# Patient Record
Sex: Female | Born: 1970 | Race: White | Hispanic: No | Marital: Married | Smoking: Former smoker
Health system: Southern US, Community
[De-identification: ages and names within clinical notes are randomized; demographics above are authoritative.]

## PROBLEM LIST (undated history)

## (undated) DIAGNOSIS — Z853 Personal history of malignant neoplasm of breast: Secondary | ICD-10-CM

## (undated) DIAGNOSIS — Z923 Personal history of irradiation: Secondary | ICD-10-CM

## (undated) DIAGNOSIS — C801 Malignant (primary) neoplasm, unspecified: Secondary | ICD-10-CM

## (undated) DIAGNOSIS — C50919 Malignant neoplasm of unspecified site of unspecified female breast: Secondary | ICD-10-CM

## (undated) HISTORY — DX: Personal history of malignant neoplasm of breast: Z85.3

## (undated) HISTORY — DX: Malignant (primary) neoplasm, unspecified: C80.1

---
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
## 1996-09-10 HISTORY — PX: CHOLECYSTECTOMY: SHX55

## 2004-09-08 ENCOUNTER — Ambulatory Visit: Payer: Self-pay | Admitting: Internal Medicine

## 2005-05-30 ENCOUNTER — Emergency Department: Payer: Self-pay | Admitting: Emergency Medicine

## 2005-10-02 ENCOUNTER — Ambulatory Visit: Payer: Self-pay | Admitting: General Practice

## 2006-05-27 ENCOUNTER — Ambulatory Visit: Payer: Self-pay | Admitting: Unknown Physician Specialty

## 2006-07-08 ENCOUNTER — Ambulatory Visit: Payer: Self-pay | Admitting: Urology

## 2006-09-10 DIAGNOSIS — Z923 Personal history of irradiation: Secondary | ICD-10-CM

## 2006-09-10 DIAGNOSIS — Z853 Personal history of malignant neoplasm of breast: Secondary | ICD-10-CM

## 2006-09-10 DIAGNOSIS — C50919 Malignant neoplasm of unspecified site of unspecified female breast: Secondary | ICD-10-CM

## 2006-09-10 HISTORY — DX: Personal history of malignant neoplasm of breast: Z85.3

## 2006-09-10 HISTORY — DX: Personal history of irradiation: Z92.3

## 2006-09-10 HISTORY — DX: Malignant neoplasm of unspecified site of unspecified female breast: C50.919

## 2006-09-10 HISTORY — PX: BREAST LUMPECTOMY: SHX2

## 2006-09-10 HISTORY — PX: BREAST EXCISIONAL BIOPSY: SUR124

## 2006-10-08 ENCOUNTER — Ambulatory Visit: Payer: Self-pay | Admitting: General Practice

## 2007-05-12 ENCOUNTER — Ambulatory Visit: Payer: Self-pay | Admitting: Oncology

## 2007-05-14 ENCOUNTER — Ambulatory Visit: Payer: Self-pay | Admitting: General Surgery

## 2007-05-14 ENCOUNTER — Other Ambulatory Visit: Payer: Self-pay

## 2007-05-21 ENCOUNTER — Ambulatory Visit: Payer: Self-pay | Admitting: General Surgery

## 2007-06-02 ENCOUNTER — Ambulatory Visit: Payer: Self-pay | Admitting: Radiation Oncology

## 2007-06-11 ENCOUNTER — Ambulatory Visit: Payer: Self-pay | Admitting: Oncology

## 2007-07-02 ENCOUNTER — Ambulatory Visit: Payer: Self-pay | Admitting: Oncology

## 2007-07-12 ENCOUNTER — Ambulatory Visit: Payer: Self-pay | Admitting: Oncology

## 2007-08-11 ENCOUNTER — Ambulatory Visit: Payer: Self-pay | Admitting: Oncology

## 2007-09-09 ENCOUNTER — Emergency Department: Payer: Self-pay | Admitting: Emergency Medicine

## 2007-09-11 ENCOUNTER — Ambulatory Visit: Payer: Self-pay | Admitting: Oncology

## 2007-10-12 ENCOUNTER — Ambulatory Visit: Payer: Self-pay | Admitting: Oncology

## 2007-10-20 ENCOUNTER — Ambulatory Visit: Payer: Self-pay | Admitting: Oncology

## 2007-11-09 ENCOUNTER — Ambulatory Visit: Payer: Self-pay | Admitting: Oncology

## 2007-12-08 ENCOUNTER — Ambulatory Visit: Payer: Self-pay | Admitting: General Surgery

## 2007-12-10 ENCOUNTER — Ambulatory Visit: Payer: Self-pay | Admitting: Oncology

## 2008-02-09 ENCOUNTER — Ambulatory Visit: Payer: Self-pay | Admitting: Oncology

## 2008-02-11 ENCOUNTER — Ambulatory Visit: Payer: Self-pay | Admitting: Oncology

## 2008-03-10 ENCOUNTER — Ambulatory Visit: Payer: Self-pay | Admitting: Oncology

## 2008-04-10 ENCOUNTER — Ambulatory Visit: Payer: Self-pay | Admitting: Oncology

## 2008-05-11 ENCOUNTER — Ambulatory Visit: Payer: Self-pay | Admitting: Oncology

## 2008-05-19 ENCOUNTER — Ambulatory Visit: Payer: Self-pay | Admitting: Oncology

## 2008-05-26 ENCOUNTER — Ambulatory Visit: Payer: Self-pay | Admitting: General Surgery

## 2008-06-10 ENCOUNTER — Ambulatory Visit: Payer: Self-pay | Admitting: Oncology

## 2008-09-10 ENCOUNTER — Ambulatory Visit: Payer: Self-pay | Admitting: Oncology

## 2008-09-20 ENCOUNTER — Ambulatory Visit: Payer: Self-pay | Admitting: Oncology

## 2008-10-11 ENCOUNTER — Ambulatory Visit: Payer: Self-pay | Admitting: Oncology

## 2008-11-22 ENCOUNTER — Ambulatory Visit: Payer: Self-pay | Admitting: General Surgery

## 2008-12-09 ENCOUNTER — Ambulatory Visit: Payer: Self-pay | Admitting: Oncology

## 2008-12-20 ENCOUNTER — Ambulatory Visit: Payer: Self-pay | Admitting: Oncology

## 2009-01-08 ENCOUNTER — Ambulatory Visit: Payer: Self-pay | Admitting: Oncology

## 2009-03-10 ENCOUNTER — Ambulatory Visit: Payer: Self-pay | Admitting: Oncology

## 2009-04-07 ENCOUNTER — Ambulatory Visit: Payer: Self-pay | Admitting: Oncology

## 2009-05-30 ENCOUNTER — Ambulatory Visit: Payer: Self-pay | Admitting: General Surgery

## 2009-09-10 ENCOUNTER — Ambulatory Visit: Payer: Self-pay | Admitting: Oncology

## 2009-10-10 ENCOUNTER — Ambulatory Visit: Payer: Self-pay | Admitting: Oncology

## 2009-10-11 ENCOUNTER — Ambulatory Visit: Payer: Self-pay | Admitting: Oncology

## 2009-11-23 ENCOUNTER — Ambulatory Visit: Payer: Self-pay | Admitting: General Surgery

## 2010-04-10 ENCOUNTER — Ambulatory Visit: Payer: Self-pay | Admitting: Oncology

## 2010-04-26 ENCOUNTER — Ambulatory Visit: Payer: Self-pay | Admitting: Oncology

## 2010-05-11 ENCOUNTER — Ambulatory Visit: Payer: Self-pay | Admitting: Oncology

## 2010-05-30 ENCOUNTER — Ambulatory Visit: Payer: Self-pay | Admitting: General Surgery

## 2010-11-29 ENCOUNTER — Ambulatory Visit: Payer: Self-pay | Admitting: General Surgery

## 2011-07-18 ENCOUNTER — Ambulatory Visit: Payer: Self-pay | Admitting: General Surgery

## 2011-12-24 ENCOUNTER — Ambulatory Visit: Payer: Self-pay | Admitting: General Surgery

## 2012-05-26 ENCOUNTER — Ambulatory Visit: Payer: Self-pay | Admitting: Oncology

## 2012-06-10 ENCOUNTER — Ambulatory Visit: Payer: Self-pay | Admitting: Oncology

## 2012-06-12 ENCOUNTER — Ambulatory Visit: Payer: Self-pay | Admitting: General Surgery

## 2012-06-13 ENCOUNTER — Other Ambulatory Visit: Payer: Self-pay | Admitting: General Surgery

## 2012-06-13 DIAGNOSIS — Z853 Personal history of malignant neoplasm of breast: Secondary | ICD-10-CM

## 2012-06-13 DIAGNOSIS — N63 Unspecified lump in unspecified breast: Secondary | ICD-10-CM

## 2012-06-13 DIAGNOSIS — N644 Mastodynia: Secondary | ICD-10-CM

## 2012-06-19 ENCOUNTER — Ambulatory Visit
Admission: RE | Admit: 2012-06-19 | Discharge: 2012-06-19 | Disposition: A | Discharge status: Home / Self Care | Payer: BC Managed Care – PPO | Source: Ambulatory Visit | Attending: General Surgery | Admitting: General Surgery

## 2012-06-19 DIAGNOSIS — N644 Mastodynia: Secondary | ICD-10-CM

## 2012-06-19 DIAGNOSIS — N63 Unspecified lump in unspecified breast: Secondary | ICD-10-CM

## 2012-06-19 DIAGNOSIS — Z853 Personal history of malignant neoplasm of breast: Secondary | ICD-10-CM

## 2012-06-19 MED ORDER — GADOBENATE DIMEGLUMINE 529 MG/ML IV SOLN
14.0000 mL | Freq: Once | INTRAVENOUS | Status: AC | PRN
  Administered 2012-06-19: 14 mL via INTRAVENOUS

## 2012-06-20 ENCOUNTER — Other Ambulatory Visit: Payer: Self-pay | Admitting: General Surgery

## 2012-06-20 DIAGNOSIS — R928 Other abnormal and inconclusive findings on diagnostic imaging of breast: Secondary | ICD-10-CM

## 2012-06-25 ENCOUNTER — Ambulatory Visit
Admission: RE | Admit: 2012-06-25 | Discharge: 2012-06-25 | Disposition: A | Discharge status: Home / Self Care | Payer: BC Managed Care – PPO | Source: Ambulatory Visit | Attending: General Surgery | Admitting: General Surgery

## 2012-06-25 ENCOUNTER — Other Ambulatory Visit: Payer: Self-pay | Admitting: Diagnostic Radiology

## 2012-06-25 DIAGNOSIS — R928 Other abnormal and inconclusive findings on diagnostic imaging of breast: Secondary | ICD-10-CM

## 2012-06-25 HISTORY — PX: BREAST BIOPSY: SHX20

## 2012-06-25 MED ORDER — GADOBENATE DIMEGLUMINE 529 MG/ML IV SOLN
14.0000 mL | Freq: Once | INTRAVENOUS | Status: AC | PRN
  Administered 2012-06-25: 14 mL via INTRAVENOUS

## 2012-11-08 ENCOUNTER — Encounter: Payer: Self-pay | Admitting: *Deleted

## 2013-01-20 ENCOUNTER — Ambulatory Visit: Payer: Self-pay | Admitting: General Surgery

## 2013-03-23 ENCOUNTER — Ambulatory Visit: Payer: Self-pay | Admitting: General Surgery

## 2013-03-30 ENCOUNTER — Ambulatory Visit: Payer: Self-pay | Admitting: General Surgery

## 2013-03-31 ENCOUNTER — Ambulatory Visit: Payer: Self-pay | Admitting: General Surgery

## 2013-04-02 ENCOUNTER — Encounter: Payer: Self-pay | Admitting: General Surgery

## 2013-04-06 ENCOUNTER — Ambulatory Visit: Payer: Self-pay | Admitting: General Surgery

## 2013-04-15 ENCOUNTER — Ambulatory Visit: Payer: Self-pay | Admitting: General Surgery

## 2013-05-04 ENCOUNTER — Ambulatory Visit (INDEPENDENT_AMBULATORY_CARE_PROVIDER_SITE_OTHER): Payer: BC Managed Care – PPO | Admitting: General Surgery

## 2013-05-04 ENCOUNTER — Encounter: Payer: Self-pay | Admitting: General Surgery

## 2013-05-04 VITALS — BP 118/68 | HR 74 | Resp 12 | Ht 69.0 in | Wt 156.0 lb

## 2013-05-04 DIAGNOSIS — Z853 Personal history of malignant neoplasm of breast: Secondary | ICD-10-CM | POA: Insufficient documentation

## 2013-05-04 NOTE — Patient Instructions (Signed)
Patient to return in one year. 

## 2013-05-04 NOTE — Progress Notes (Signed)
Patient ID: Jamie Wu, female   DOB: 1970-11-04, 42 y.o.   MRN: [id number]  Chief Complaint  Patient presents with  . Follow-up    mammogram    HPI Jamie Wu is a 42 y.o. female who presents for a breast evaluation. The most recent mammogram was done on 03/30/13 cat 2. Patient does perform regular self breast checks and gets regular mammograms done.    HPI  Past Medical History  Diagnosis Date  . Personal history of malignant neoplasm of breast 2008    L LUMPECTOMY  . 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 T1, N0, M0, histologic grade 1.  . Diabetes mellitus without complication     Past Surgical History  Procedure Laterality Date  . Breast lumpectomy Left 2008  . Cholecystectomy  1998  . Breast biopsy Left 2008    Family History  Problem Relation Age of Onset  . Colon cancer Paternal Uncle     lat3 50's   . Ovarian cancer    . Breast cancer      Social History History  Substance Use Topics  . Smoking status: Former Smoker -- 1.00 packs/day for 3 years  . Smokeless tobacco: Never Used  . Alcohol Use: Yes    Allergies  Allergen Reactions  . Sulfa Antibiotics Hives    No current outpatient prescriptions on file.   No current facility-administered medications for this visit.    Review of Systems Review of Systems  Constitutional: Negative.   Respiratory: Negative.   Cardiovascular: Negative.     Blood pressure 118/68, pulse 74, resp. rate 12, height 5\' 9"  (1.753 m), weight 156 lb (70.761 kg), last menstrual period 04/06/2013.  Physical Exam Physical Exam  Constitutional: She is oriented to person, place, and time. She appears well-developed and well-nourished.  Cardiovascular: Normal rate, regular rhythm and normal heart sounds.   Pulmonary/Chest: Breath sounds normal. Right breast exhibits no inverted nipple, no mass, no nipple discharge, no skin change and  no tenderness. Left breast exhibits no inverted nipple, no mass, no nipple discharge, no skin change and no tenderness.  Well healed left breast lower outer quadrant scar. Less than 1/2 cup size volume differential, left smaller.  Lymphadenopathy:    She has no cervical adenopathy.    She has no axillary adenopathy.  Neurological: She is alert and oriented to person, place, and time.  Skin: Skin is warm and dry.    Data Reviewed Bilateral mammograms dated March 30, 2013 were unremarkable. BI-RAD-2.  MRI guided biopsy of the right upper inner quadrant area dated June 25, 2012 showed fibrocystic changes with microcalcifications.  Assessment    Benign breast exam.     Plan    The patient desires to continue annual exams to this office.  The importance of a screening colonoscopy right 49 was reviewed in light of her paternal uncles family history and her history of breast cancer.        Earline Mayotte 05/04/2013, 10:24 PM

## 2014-04-16 ENCOUNTER — Encounter: Payer: Self-pay | Admitting: General Surgery

## 2014-04-20 ENCOUNTER — Encounter: Payer: Self-pay | Admitting: General Surgery

## 2014-04-30 ENCOUNTER — Encounter: Payer: Self-pay | Admitting: General Surgery

## 2014-05-03 ENCOUNTER — Ambulatory Visit (INDEPENDENT_AMBULATORY_CARE_PROVIDER_SITE_OTHER): Payer: BC Managed Care – PPO | Admitting: General Surgery

## 2014-05-03 ENCOUNTER — Encounter: Payer: Self-pay | Admitting: General Surgery

## 2014-05-03 VITALS — BP 124/76 | HR 76 | Resp 12 | Ht 69.0 in | Wt 155.0 lb

## 2014-05-03 DIAGNOSIS — Z853 Personal history of malignant neoplasm of breast: Secondary | ICD-10-CM

## 2014-05-03 NOTE — Patient Instructions (Addendum)
The patient has been asked to return to the office in one year with a bilateral screening mammogram. 

## 2014-05-03 NOTE — Progress Notes (Signed)
Patient ID: Jamie Wu, female   DOB: Sep 13, 1970, 43 y.o.   MRN: [id number]  Chief Complaint  Patient presents with  . Follow-up    mammogram    HPI Jamie Wu is a 43 y.o. female who presents for a breast evaluation. The most recent mammogram was done on 04/20/14 at [facility].  Patient states her left breast there is a tight feeling occasionally. Patient does perform regular self breast checks and gets regular mammograms done.   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   HPI  Past Medical History  Diagnosis Date  . Personal history of malignant neoplasm of breast 2008    L LUMPECTOMY  . Diabetes mellitus without complication   . 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 T1, N0, M0, histologic grade 1, BRCA negative by patient report.    Past Surgical History  Procedure Laterality Date  . Cholecystectomy  1998  . Breast lumpectomy Left 2008  . Breast biopsy Right June 25, 2012    MRI abnormality showing fibrocystic changes with microcalcifications.    Family History  Problem Relation Age of Onset  . Colon cancer Paternal Uncle     lat3 50's   . Ovarian cancer    . Breast cancer      Social History History  Substance Use Topics  . Smoking status: Former Smoker -- 1.00 packs/day for 3 years  . Smokeless tobacco: Never Used  . Alcohol Use: Yes    Allergies  Allergen Reactions  . Sulfa Antibiotics Hives    No current outpatient prescriptions on file.   No current facility-administered medications for this visit.    Review of Systems Review of Systems  Constitutional: Negative.   Respiratory: Negative.   Cardiovascular: Negative.     Blood pressure 124/76, pulse 76, resp. rate 12, height 5' 9"  (1.753 m), weight 155 lb (70.308 kg), last menstrual period 04/05/2014.  Physical Exam Physical  Exam  Constitutional: She is oriented to person, place, and time. She appears well-developed and well-nourished.  Eyes: Conjunctivae are normal. No scleral icterus.  Neck: Neck supple.  Cardiovascular: Normal rate, regular rhythm and normal heart sounds.   Pulmonary/Chest: Effort normal and breath sounds normal. Right breast exhibits no inverted nipple, no mass, no nipple discharge, no skin change and no tenderness. Left breast exhibits no inverted nipple, no mass, no nipple discharge, no skin change and no tenderness.  Lymphadenopathy:    She has no cervical adenopathy.    She has no axillary adenopathy.  Neurological: She is alert and oriented to person, place, and time.  Skin: Skin is warm and dry.    Data Reviewed Bilateral mammograms dated April 20, 2014 were reviewed and compared to previous exams. No interval change. BI-RAD-2.  Assessment     Well now 7 years status post conservative treatment of left breast cancer.     Plan    Will arrange for screening mammograms in one year with an office visit to follow.      PCP: Jayme Cloud 05/04/2014, 12:18 PM

## 2014-07-12 ENCOUNTER — Encounter: Payer: Self-pay | Admitting: General Surgery

## 2015-03-07 ENCOUNTER — Other Ambulatory Visit: Payer: Self-pay

## 2015-03-07 DIAGNOSIS — Z853 Personal history of malignant neoplasm of breast: Secondary | ICD-10-CM

## 2015-03-15 ENCOUNTER — Encounter: Payer: Self-pay | Admitting: General Surgery

## 2015-03-15 ENCOUNTER — Ambulatory Visit (INDEPENDENT_AMBULATORY_CARE_PROVIDER_SITE_OTHER): Payer: BLUE CROSS/BLUE SHIELD | Admitting: General Surgery

## 2015-03-15 VITALS — BP 100/64 | HR 88 | Resp 12 | Ht 69.0 in | Wt 155.0 lb

## 2015-03-15 DIAGNOSIS — R1031 Right lower quadrant pain: Secondary | ICD-10-CM

## 2015-03-15 NOTE — Patient Instructions (Signed)
The patient is aware to call back for any questions or concerns.  

## 2015-03-15 NOTE — Progress Notes (Signed)
Patient ID: Jamie Wu, female   DOB: May 25, 1971, 44 y.o.   MRN: [id number]  Chief Complaint  Patient presents with  . Other    pelivc pain    HPI Jamie Wu is a 44 y.o. female here today or an evaluation of lower right pelvic pain. She states this has been going on for a couple of years now. Last for about 5 to 7 days.  Pain describe as sharp (transient) as well as dull/ achy. The most recent episode within the last 2 weeks was the most severe, forcing her to curtail her activities for about 3 days. Episodes in the past of been annoying but not activity limiting.  She has not appreciated any activities that precipitated the pain or any particular treatments that relieve it. Tylenol/non-steroidal have not been helpful. She had an ultrasound  performed on 03/03/15. She states her emotional status was up and down in December due to many changes where she is employed as a Museum/gallery conservator Primary school teacher) and she made use of Xanax for about a month at that time with relief of her symptoms. With her most recent a Sparr episode of discomfort she started back on her anxiety medication. She states the medication does help.  Denies pain with intercourse. Menstrual periods are regular. The patient's episodes of pain seemed to come monthly but are not correlated with her menses.  No change in bowel habits.  The patient reports that she thinks that she is voiding more frequently. She does admit to drinking large quantities of fluid throughout the day and that when she does have the urge to void she will pass large monitor urine. She has appreciated some modest decrease in the time she has between the sense of needing to void and when she needs to the restroom. She is not following to that she's had any episodes of incontinence. Most recent normal labs were January.  HPI  Past Medical History  Diagnosis Date  . Personal history of malignant neoplasm of breast 2008    L LUMPECTOMY  . Diabetes  mellitus without complication   . 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 T1, N0, M0, histologic grade 1, BRCA negative by patient report.    Past Surgical History  Procedure Laterality Date  . Cholecystectomy  1998  . Breast lumpectomy Left 2008  . Breast biopsy Right June 25, 2012    MRI abnormality showing fibrocystic changes with microcalcifications.    Family History  Problem Relation Age of Onset  . Colon cancer Paternal Uncle     lat3 50's   . Breast cancer      Social History History  Substance Use Topics  . Smoking status: Former Smoker -- 1.00 packs/day for 3 years  . Smokeless tobacco: Never Used  . Alcohol Use: Yes    Allergies  Allergen Reactions  . Sulfa Antibiotics Hives    Current Outpatient Prescriptions  Medication Sig Dispense Refill  . ALPRAZolam (XANAX) 0.25 MG tablet Take 0.25 mg by mouth at bedtime as needed for anxiety.    . calcium & magnesium carbonates (MYLANTA) 311-232 MG per tablet Take 1 tablet by mouth daily.    . Cyanocobalamin (VITAMIN B 12 PO) Take by mouth.    . Thiamine HCl (VITAMIN B-1) 250 MG tablet Take 250 mg by mouth daily.     No current facility-administered medications for this visit.  Review of Systems Review of Systems  Constitutional: Negative.   Respiratory: Negative.   Cardiovascular: Negative.   Gastrointestinal: Positive for abdominal pain. Negative for nausea, vomiting, diarrhea, constipation, blood in stool, abdominal distention, anal bleeding and rectal pain.    Blood pressure 100/64, pulse 88, resp. rate 12, height 5' 9"  (1.753 m), weight 155 lb (70.308 kg), last menstrual period 03/01/2015.  Physical Exam Physical Exam  Constitutional: She is oriented to person, place, and time. She appears well-developed and well-nourished.  HENT:  Mouth/Throat: Oropharynx is clear and moist.  Eyes: Conjunctivae are normal. No  scleral icterus.  Neck: Neck supple.  Cardiovascular: Normal rate, regular rhythm and normal heart sounds.   Pulses:      Dorsalis pedis pulses are 2+ on the right side, and 2+ on the left side.       Posterior tibial pulses are 2+ on the right side, and 2+ on the left side.  Pulmonary/Chest: Effort normal and breath sounds normal.  Abdominal: Soft. Bowel sounds are normal. There is no hepatosplenomegaly. There is no tenderness. There is no CVA tenderness. No hernia.    Musculoskeletal:       Legs: Lymphadenopathy:    She has no cervical adenopathy.  Neurological: She is alert and oriented to person, place, and time.  Skin: Skin is warm and dry.    Data Reviewed GYN ultrasound dated 03/03/2015 no free fluid, normal ovaries with follicular cyst on the right more so than left, less than 2 cm. 10 mm endometrial thickness ( day 9 of her menstrual cycle).  Assessmet        Episodic right lower quadrant pain, negative clinical exam.     Plan    With no GI symptoms,   a negative clinical exam and pelvic ultrasound with findings as noted above, I think there is little to be gained by obtaining a CT scan or completing a colonoscopy. I think she would best served by diagnostic laparoscopic exam completed by the GYN service.  I would be happy to be present during this to exam to evaluate the abdominal viscera.  He patient will contact Dr. Ammie Dalton later this week to discuss his final recommendations regarding laparoscopic exam.   Follow up next month with mammogram as previously  scheduled.    Ref. Dr. Percell Boston, [name] 03/15/2015, 9:56 PM

## 2015-05-02 ENCOUNTER — Ambulatory Visit (INDEPENDENT_AMBULATORY_CARE_PROVIDER_SITE_OTHER): Payer: BLUE CROSS/BLUE SHIELD | Admitting: General Surgery

## 2015-05-02 ENCOUNTER — Encounter: Payer: Self-pay | Admitting: General Surgery

## 2015-05-02 VITALS — BP 120/70 | HR 68 | Resp 14 | Ht 69.0 in | Wt 156.0 lb

## 2015-05-02 DIAGNOSIS — Z853 Personal history of malignant neoplasm of breast: Secondary | ICD-10-CM | POA: Diagnosis not present

## 2015-05-02 NOTE — Progress Notes (Addendum)
Patient ID: Jamie Wu, female   DOB: 06/23/1971, 44 y.o.   MRN: [id number]  Chief Complaint  Patient presents with  . Follow-up    Bilateral Mammogram    HPI Jamie Wu is a 44 y.o. female who presents for a breast evaluation. The most recent mammogram was done on 04/28/15. Patient does perform regular self breast checks and gets regular mammograms done. She states that she has had some cyclical breast tenderness bilaterally.     HPI  Past Medical History  Diagnosis Date  . Personal history of malignant neoplasm of breast 2008    L LUMPECTOMY  . 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 T1, N0, M0, histologic grade 1, BRCA negative by patient report.    Past Surgical History  Procedure Laterality Date  . Breast lumpectomy Left 2008  . Breast biopsy Right June 25, 2012    MRI abnormality showing fibrocystic changes with microcalcifications.  . Cholecystectomy  1998    Family History  Problem Relation Age of Onset  . Colon cancer Paternal Uncle     lat3 41's     Social History Social History  Substance Use Topics  . Smoking status: Former Smoker -- 1.00 packs/day for 3 years    Quit date: 09/10/1993  . Smokeless tobacco: Never Used  . Alcohol Use: 0.0 oz/week    0 Standard drinks or equivalent per week     Comment: occas-wine    Allergies  Allergen Reactions  . Sulfa Antibiotics Hives    Current Outpatient Prescriptions  Medication Sig Dispense Refill  . ALPRAZolam (XANAX) 0.25 MG tablet Take 0.25 mg by mouth at bedtime as needed for anxiety.    . calcium & magnesium carbonates (MYLANTA) 311-232 MG per tablet Take 1 tablet by mouth daily.    . Cyanocobalamin (VITAMIN B 12 PO) Take by mouth.    . Thiamine HCl (VITAMIN B-1) 250 MG tablet Take 250 mg by mouth daily.     No current facility-administered medications for this visit.    Review of Systems Review of Systems   Constitutional: Negative.   Respiratory: Negative.   Cardiovascular: Negative.     Blood pressure 120/70, pulse 68, resp. rate 14, height 5' 9"  (1.753 m), weight 156 lb (70.761 kg), last menstrual period 04/17/2015.  Physical Exam Physical Exam  Constitutional: She is oriented to person, place, and time. She appears well-developed and well-nourished.  Eyes: Conjunctivae are normal. No scleral icterus.  Neck: Neck supple.  Cardiovascular: Normal rate, regular rhythm and normal heart sounds.   Pulmonary/Chest: Effort normal and breath sounds normal. Right breast exhibits no inverted nipple, no mass, no nipple discharge, no skin change and no tenderness. Left breast exhibits no inverted nipple, no mass, no nipple discharge, no skin change and no tenderness.    Well healed incision left breast 4o'ckl, and a well healed left axillary incision.   Lymphadenopathy:    She has no cervical adenopathy.  Neurological: She is alert and oriented to person, place, and time.  Skin: Skin is warm and dry.  Psychiatric: She has a normal mood and affect.    Data Reviewed Laboratory studies dated 08/16/2014 were reviewed. The only abnormality was her serum total bilirubin which was 1.3. In February 2008 this was 1.7 mg/daL consistent with Gilbert's syndrome.  Bilateral diagnostic mammograms and ureteral lateral right breast mammogram dated 04/29/2015 was reviewed. Multiple subcentimeter nodules  were noted in the right breast. Ultrasound showed multiple cysts. BI-RADS-2.  Assessment    Doing well now 8 years status post management of a T1b carcinoma the left breast.    Plan    The patient was offered the operative 8 to have her annual mammograms, screening, completed with her GYN. This is acceptable to her. She is welcome to return at any time if any concerns develop on clinical exam or mammography.  The patient should plan on a screening colonoscopy at age 37. She reports that her older sister is  due to have a study within the next year. If her sister has adenomatous polyps the patient should be screened early.      PCP: [facility]YN: Percell Boston, [name] 05/02/2015, 4:03 PM

## 2015-05-02 NOTE — Patient Instructions (Addendum)
Continue self breast exams. Call office for any new breast issues or concerns. Bilateral Screening Mammogram in one year with GYN.

## 2015-09-01 ENCOUNTER — Other Ambulatory Visit: Payer: Self-pay | Admitting: Family Medicine

## 2015-09-01 DIAGNOSIS — R109 Unspecified abdominal pain: Secondary | ICD-10-CM

## 2015-09-06 ENCOUNTER — Ambulatory Visit: Admission: RE | Admit: 2015-09-06 | Payer: Self-pay | Source: Ambulatory Visit

## 2015-09-06 ENCOUNTER — Ambulatory Visit
Admission: RE | Admit: 2015-09-06 | Discharge: 2015-09-06 | Disposition: A | Discharge status: Home / Self Care | Payer: BLUE CROSS/BLUE SHIELD | Source: Ambulatory Visit | Attending: Family Medicine | Admitting: Family Medicine

## 2015-09-06 DIAGNOSIS — R1031 Right lower quadrant pain: Secondary | ICD-10-CM | POA: Insufficient documentation

## 2015-09-06 DIAGNOSIS — K5730 Diverticulosis of large intestine without perforation or abscess without bleeding: Secondary | ICD-10-CM | POA: Diagnosis not present

## 2015-09-06 DIAGNOSIS — K7689 Other specified diseases of liver: Secondary | ICD-10-CM | POA: Diagnosis not present

## 2015-09-06 DIAGNOSIS — Z853 Personal history of malignant neoplasm of breast: Secondary | ICD-10-CM | POA: Diagnosis present

## 2015-09-06 DIAGNOSIS — N281 Cyst of kidney, acquired: Secondary | ICD-10-CM | POA: Diagnosis not present

## 2015-09-06 MED ORDER — IOHEXOL 350 MG/ML SOLN
85.0000 mL | Freq: Once | INTRAVENOUS | Status: AC | PRN
  Administered 2015-09-06: 85 mL via INTRAVENOUS

## 2015-09-15 ENCOUNTER — Encounter: Payer: Self-pay | Admitting: General Surgery

## 2015-09-15 ENCOUNTER — Ambulatory Visit (INDEPENDENT_AMBULATORY_CARE_PROVIDER_SITE_OTHER): Payer: BLUE CROSS/BLUE SHIELD | Admitting: General Surgery

## 2015-09-15 VITALS — BP 104/68 | HR 88 | Resp 12 | Ht 69.0 in | Wt 147.0 lb

## 2015-09-15 DIAGNOSIS — R1031 Right lower quadrant pain: Secondary | ICD-10-CM

## 2015-09-15 DIAGNOSIS — N6001 Solitary cyst of right breast: Secondary | ICD-10-CM | POA: Diagnosis not present

## 2015-09-15 NOTE — Progress Notes (Signed)
Patient ID: Jamie Wu, female   DOB: 27-Apr-1971, 45 y.o.   MRN: [id number]  Chief Complaint  Patient presents with  . Abdominal Pain    HPI Jamie Wu is a 45 y.o. female here today for a evaluation of abdominal pain. She had a ct scan done on 09/06/15. She states she has been having lower right abdomen pain to right flank and hip "in a band". It has been going on at lest since June 2016 or even longer. She remembers the pain being bad August 10 2015 at a party. Within the last week minimal to no pain, prior to that it was consistent but not constant, lasting hours. If she overeats she notices the pain. Moving her bowels can make it better. The nausea would come and go not associated with the pain. The diarrhea and constipation is intermittent and more assocatied with her diet over the past 2 years.  Denies rectal pain. No persistent discomfort during intercourse.  The patient's weight is down 8 pounds over the past year, no precipitous drop.  She can also feel something new under her right nipple. She states it is about "BB" in size and found it about 3-4 days ago.  I reviewed the patient's clinical history. HPI  Past Medical History  Diagnosis Date  . Personal history of malignant neoplasm of breast 2008    L LUMPECTOMY  . 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 T1, N0, M0, histologic grade 1, BRCA negative by patient report.    Past Surgical History  Procedure Laterality Date  . Breast lumpectomy Left 2008  . Breast biopsy Right June 25, 2012    MRI abnormality showing fibrocystic changes with microcalcifications.  . Cholecystectomy  1998    Family History  Problem Relation Age of Onset  . Colon cancer Paternal Uncle     lat3 59's     Social History Social History  Substance Use Topics  . Smoking status: Former Smoker -- 1.00 packs/day for 3 years    Quit date:  09/10/1993  . Smokeless tobacco: Never Used  . Alcohol Use: 0.0 oz/week    0 Standard drinks or equivalent per week     Comment: occas-wine    Allergies  Allergen Reactions  . Contrast Media [Iodinated Diagnostic Agents] Hives    Pt get hives, itching and sneezing.   . Sulfa Antibiotics Hives    No current outpatient prescriptions on file.   No current facility-administered medications for this visit.    Review of Systems Review of Systems  Constitutional: Negative.   Respiratory: Negative.   Cardiovascular: Negative.   Gastrointestinal: Positive for nausea, abdominal pain, diarrhea and constipation. Negative for vomiting.    Blood pressure 104/68, pulse 88, resp. rate 12, height 5' 9"  (1.753 m), weight 147 lb (66.679 kg), last menstrual period 08/22/2015.  Physical Exam Physical Exam  Constitutional: She is oriented to person, place, and time. She appears well-developed and well-nourished.  HENT:  Mouth/Throat: Oropharynx is clear and moist.  Eyes: Conjunctivae are normal. No scleral icterus.  Neck: Neck supple.  Cardiovascular: Normal rate, regular rhythm and normal heart sounds.   Pulses:      Femoral pulses are 2+ on the right side, and 2+ on the left side.      Dorsalis pedis pulses are 2+ on the right side, and 2+ on the left side.  No lower  leg edema.  Pulmonary/Chest: Effort normal and breath sounds normal. Right breast exhibits mass. Right breast exhibits no inverted nipple, no nipple discharge, no skin change and no tenderness. Left breast exhibits no inverted nipple, no mass, no nipple discharge, no skin change and no tenderness.  Well healed incision at 3 o'clock left breast. 6 mm nodule 8 o'clock edge of areolar right breast.  Abdominal: Soft. Normal appearance and bowel sounds are normal. There is no tenderness.  Musculoskeletal: Normal range of motion.  Lymphadenopathy:    She has no cervical adenopathy.    She has no axillary adenopathy.  Neurological:  She is alert and oriented to person, place, and time.  Skin: Skin is warm and dry.  Psychiatric: Her behavior is normal.    [facility] physician notes from 09/01/2015 reviewed.  CT scan of the abdomen and pelvis dated 09/06/2015 was personally reviewed. CT showed suspected slightly complex cyst above the liver in the right lobe and as well as in the kidney. Of unlikely significance. The cecum extends down into the pelvis and extends across the midline to the right. Diverticulosis.  Urine culture of December 22 was less than 10,000 colonies. Laboratory studies of the same date showed a normal white blood cell count of 5200 with an MCV of 86, hemoglobin of 14.2.   04/29/2015 mammogram and ultrasound completed at [facility] had confirmed multiple cyst within the right breast, one corresponding exactly what the lesion palpable on today's exam.  Assessment    Right lower quadrant pain of unclear etiology, no suspicion for colonic source. No evidence suggest metastatic disease/bony involvement.  Breast cyst, now notable with modest weight loss.    Plan    No specific recommendations at present.    Monitor and call for worsening symptoms. Follow up as scheduled.    PCP:  Dr.Joseph Corinda Gubler   This information has been scribed by Karie Fetch RNBC.   Robert Bellow 09/17/2015, 10:07 AM

## 2015-09-15 NOTE — Patient Instructions (Signed)
The patient is aware to call back for any questions or concerns.  

## 2016-09-25 ENCOUNTER — Telehealth: Payer: Self-pay | Admitting: *Deleted

## 2016-09-25 NOTE — Telephone Encounter (Signed)
Patient called and wanted to know if you could continue her care and mammograms? She has not had a mammogram since 04/2015. She stated her regular GYN doctor retired and she has not found a new one. I offered her [facility] information and she declined. Please advise. Thanks

## 2016-09-26 NOTE — Telephone Encounter (Signed)
That will be fine.  Screening mammogram and OV to follow.

## 2016-10-01 ENCOUNTER — Other Ambulatory Visit: Payer: Self-pay | Admitting: General Surgery

## 2016-10-01 ENCOUNTER — Encounter: Payer: Self-pay | Admitting: *Deleted

## 2016-10-01 DIAGNOSIS — Z1239 Encounter for other screening for malignant neoplasm of breast: Secondary | ICD-10-CM

## 2016-10-30 ENCOUNTER — Ambulatory Visit
Admission: RE | Admit: 2016-10-30 | Discharge: 2016-10-30 | Disposition: A | Discharge status: Home / Self Care | Payer: BLUE CROSS/BLUE SHIELD | Source: Ambulatory Visit | Attending: General Surgery | Admitting: General Surgery

## 2016-10-30 DIAGNOSIS — Z1231 Encounter for screening mammogram for malignant neoplasm of breast: Secondary | ICD-10-CM | POA: Diagnosis present

## 2016-10-30 DIAGNOSIS — Z1239 Encounter for other screening for malignant neoplasm of breast: Secondary | ICD-10-CM

## 2016-11-13 ENCOUNTER — Ambulatory Visit: Payer: BLUE CROSS/BLUE SHIELD | Admitting: General Surgery

## 2017-05-02 ENCOUNTER — Encounter: Payer: Self-pay | Admitting: Medical Oncology

## 2017-05-02 ENCOUNTER — Emergency Department
Admission: EM | Admit: 2017-05-02 | Discharge: 2017-05-02 | Disposition: A | Discharge status: Home / Self Care | Payer: BLUE CROSS/BLUE SHIELD | Attending: Emergency Medicine | Admitting: Emergency Medicine

## 2017-05-02 ENCOUNTER — Emergency Department: Payer: BLUE CROSS/BLUE SHIELD

## 2017-05-02 DIAGNOSIS — Z87891 Personal history of nicotine dependence: Secondary | ICD-10-CM | POA: Diagnosis not present

## 2017-05-02 DIAGNOSIS — R1032 Left lower quadrant pain: Secondary | ICD-10-CM | POA: Diagnosis present

## 2017-05-02 DIAGNOSIS — R109 Unspecified abdominal pain: Secondary | ICD-10-CM

## 2017-05-02 LAB — URINALYSIS, COMPLETE (UACMP) WITH MICROSCOPIC
Bilirubin Urine: NEGATIVE
GLUCOSE, UA: NEGATIVE mg/dL
Hgb urine dipstick: NEGATIVE
Ketones, ur: NEGATIVE mg/dL
LEUKOCYTES UA: NEGATIVE
Nitrite: NEGATIVE
PROTEIN: NEGATIVE mg/dL
Specific Gravity, Urine: 1.003 — ABNORMAL LOW (ref 1.005–1.030)
pH: 6 (ref 5.0–8.0)

## 2017-05-02 LAB — BASIC METABOLIC PANEL
Anion gap: 5 (ref 5–15)
BUN: 11 mg/dL (ref 6–20)
CO2: 27 mmol/L (ref 22–32)
Calcium: 9.1 mg/dL (ref 8.9–10.3)
Chloride: 105 mmol/L (ref 101–111)
Creatinine, Ser: 0.65 mg/dL (ref 0.44–1.00)
GFR calc Af Amer: >60 mL/min (ref >60)
GLUCOSE: 88 mg/dL (ref 65–99)
POTASSIUM: 3.6 mmol/L (ref 3.5–5.1)
SODIUM: 137 mmol/L (ref 135–145)

## 2017-05-02 LAB — CBC
HEMATOCRIT: 40.2 % (ref 35.0–47.0)
HEMOGLOBIN: 13.8 g/dL (ref 12.0–16.0)
MCH: 29.2 pg (ref 26.0–34.0)
MCHC: 34.4 g/dL (ref 32.0–36.0)
MCV: 84.8 fL (ref 80.0–100.0)
Platelets: 210 10*3/uL (ref 150–440)
RBC: 4.74 MIL/uL (ref 3.80–5.20)
RDW: 12.8 % (ref 11.5–14.5)
WBC: 6 10*3/uL (ref 3.6–11.0)

## 2017-05-02 LAB — POCT PREGNANCY, URINE: PREG TEST UR: NEGATIVE

## 2017-05-02 MED ORDER — KETOROLAC TROMETHAMINE 10 MG PO TABS: 10.0000 mg | ORAL_TABLET | Freq: Four times a day (QID) | ORAL | 0 refills | Status: DC | PRN

## 2017-05-02 MED ORDER — KETOROLAC TROMETHAMINE 10 MG PO TABS
10.0000 mg | ORAL_TABLET | Freq: Once | ORAL | Status: AC
  Administered 2017-05-02: 10 mg via ORAL
  Filled 2017-05-02: qty 1

## 2017-05-02 NOTE — Discharge Instructions (Signed)
Results for orders placed or performed during the hospital encounter of 05/02/17  Urinalysis, Complete w Microscopic  Result Value Ref Range   Color, Urine STRAW (A) YELLOW   APPearance CLEAR (A) CLEAR   Specific Gravity, Urine 1.003 (L) 1.005 - 1.030   pH 6.0 5.0 - 8.0   Glucose, UA NEGATIVE NEGATIVE mg/dL   Hgb urine dipstick NEGATIVE NEGATIVE   Bilirubin Urine NEGATIVE NEGATIVE   Ketones, ur NEGATIVE NEGATIVE mg/dL   Protein, ur NEGATIVE NEGATIVE mg/dL   Nitrite NEGATIVE NEGATIVE   Leukocytes, UA NEGATIVE NEGATIVE   RBC / HPF 0-5 0 - 5 RBC/hpf   WBC, UA 0-5 0 - 5 WBC/hpf   Bacteria, UA RARE (A) NONE SEEN   Squamous Epithelial / LPF 0-5 (A) NONE SEEN  Basic metabolic panel  Result Value Ref Range   Sodium 137 135 - 145 mmol/L   Potassium 3.6 3.5 - 5.1 mmol/L   Chloride 105 101 - 111 mmol/L   CO2 27 22 - 32 mmol/L   Glucose, Bld 88 65 - 99 mg/dL   BUN 11 6 - 20 mg/dL   Creatinine, Ser 0.65 0.44 - 1.00 mg/dL   Calcium 9.1 8.9 - 10.3 mg/dL   GFR calc non Af Amer >60 >60 mL/min   GFR calc Af Amer >60 >60 mL/min   Anion gap 5 5 - 15  CBC  Result Value Ref Range   WBC 6.0 3.6 - 11.0 K/uL   RBC 4.74 3.80 - 5.20 MIL/uL   Hemoglobin 13.8 12.0 - 16.0 g/dL   HCT 40.2 35.0 - 47.0 %   MCV 84.8 80.0 - 100.0 fL   MCH 29.2 26.0 - 34.0 pg   MCHC 34.4 32.0 - 36.0 g/dL   RDW 12.8 11.5 - 14.5 %   Platelets 210 150 - 440 K/uL  Pregnancy, urine POC  Result Value Ref Range   Preg Test, Ur NEGATIVE NEGATIVE   Ct Renal Stone Study  Result Date: 05/02/2017 CLINICAL DATA:  Pt reports left sided flank pain that started yesterday and is radiating down into abdomen. Pt denies dysuria or fever. No hx of kidney stones. EXAM: CT ABDOMEN AND PELVIS WITHOUT CONTRAST TECHNIQUE: Multidetector CT imaging of the abdomen and pelvis was performed following the standard protocol without IV contrast. COMPARISON:  09/06/2015 FINDINGS: Lower chest: Lung bases are clear. Hepatobiliary: No focal hepatic lesion.  Postcholecystectomy. No biliary dilatation. Low-density cysts in the posterior RIGHT hepatic lobe. Pancreas: Pancreas is normal. No ductal dilatation. No pancreatic inflammation. Spleen: Normal spleen Adrenals/urinary tract: Adrenal glands are normal. No nephrolithiasis or ureterolithiasis. Multiple vascular calcifications in the pelvis. Bladder normal. No bladder calculi identified. Along the lateral margin of the LEFT kidney there is a simple fluid density 18 mm lesion unchanged from comparison exam 2016 Stomach/Bowel: Stomach, small bowel, appendix, and cecum are normal. Several diverticular the descending colon sigmoid colon without acute inflammation. Rectum normal Vascular/Lymphatic: Abdominal aorta is normal caliber. There is no retroperitoneal or periportal lymphadenopathy. No pelvic lymphadenopathy. Reproductive: Uterus and ovaries normal Other: Small volume free fluid in the posterior cul-de-sac (image 70, series 2 Musculoskeletal: No aggressive osseous lesion. IMPRESSION: 1. No nephrolithiasis or ureterolithiasis. 2. Extensive LEFT colon sigmoid colon diverticulosis but no clear evidence acute diverticulitis. 3. Small volume of free fluid the pelvis with indeterminate etiology. Probable physiologic . 4. Cholecystectomy. Electronically Signed   By: Suzy Bouchard M.D.   On: 05/02/2017 10:48

## 2017-05-02 NOTE — ED Triage Notes (Signed)
Pt reports left sided flank pain that started yesterday and is radiating down into abdomen. Pt denies dysuria or fever. NAD noted.

## 2017-05-02 NOTE — ED Provider Notes (Signed)
[facility] Emergency Department Provider Note  ____________________________________________  Time seen: Approximately 10:24 AM  I have reviewed the triage vital signs and the nursing notes.   HISTORY  Chief Complaint Flank Pain    HPI Jamie Wu is a 46 y.o. female who complains of left flank pain radiating to the left lower quadrant that started at 10 AM yesterday. Pain is been constant but waxing and waning, severe, described as burning. No aggravating or alleviating factors. Denies dysuria frequency urgency fevers chills or sweats. Never had anything like this before. Denies history of kidney stones.     Past Medical History:  Diagnosis Date  . 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 T1, N0, M0, histologic grade 1, BRCA negative by patient report.  . Personal history of malignant neoplasm of breast 2008   L LUMPECTOMY  Patient completed radiation without chemotherapy   Patient Active Problem List   Diagnosis Date Noted  . Right lower quadrant abdominal pain 03/15/2015  . History of breast cancer 05/04/2013     Past Surgical History:  Procedure Laterality Date  . BREAST BIOPSY Right June 25, 2012   MRI abnormality showing fibrocystic changes with microcalcifications.  Marland Kitchen BREAST EXCISIONAL BIOPSY Left 2008   breast ca  . BREAST LUMPECTOMY Left 2008  . CHOLECYSTECTOMY  1998     Prior to Admission medications   Medication Sig Start Date End Date Taking? Authorizing Provider  ketorolac (TORADOL) 10 MG tablet Take 1 tablet (10 mg total) by mouth every 6 (six) hours as needed for moderate pain. 05/02/17   Carrie Mew, MD  None, Tylenol as needed   Allergies Contrast media [iodinated diagnostic agents] and Sulfa antibiotics   Family History  Problem Relation Age of Onset  . Colon cancer Paternal Uncle        lat3 33's     Social  History Social History  Substance Use Topics  . Smoking status: Former Smoker    Packs/day: 1.00    Years: 3.00    Quit date: 09/10/1993  . Smokeless tobacco: Never Used  . Alcohol use 0.0 oz/week     Comment: occas-wine    Review of Systems  Constitutional:   No fever or chills.  ENT:   No sore throat. No rhinorrhea. Cardiovascular:   No chest pain or syncope. Respiratory:   No dyspnea or cough. Gastrointestinal:   Left flank pain as above. No vomiting or diarrhea..  Musculoskeletal:   Negative for focal pain or swelling All other systems reviewed and are negative except as documented above in ROS and HPI.  ____________________________________________   PHYSICAL EXAM:  VITAL SIGNS: ED Triage Vitals  Enc Vitals Group     BP 05/02/17 0938 140/87     Pulse Rate 05/02/17 0938 77     Resp 05/02/17 0938 16     Temp 05/02/17 0938 97.7 F (36.5 C)     Temp Source 05/02/17 0938 Oral     SpO2 05/02/17 0938 100 %     Weight 05/02/17 0939 150 lb (68 kg)     Height 05/02/17 0939 5' 8"  (1.727 m)     Head Circumference --      Peak Flow --      Pain Score 05/02/17 0938 6     Pain Loc --      Pain Edu? --      Excl. in  GC? --     Vital signs reviewed, nursing assessments reviewed.   Constitutional:   Alert and oriented. Well appearing and in no distress. Eyes:   No scleral icterus.  EOMI. No nystagmus. No conjunctival pallor. PERRL. ENT   Head:   Normocephalic and atraumatic.   Nose:   No congestion/rhinnorhea.    Mouth/Throat:   MMM, no pharyngeal erythema. No peritonsillar mass.    Neck:   No meningismus. Full ROM Hematological/Lymphatic/Immunilogical:   No cervical lymphadenopathy. Cardiovascular:   RRR. Symmetric bilateral radial and DP pulses.  No murmurs.  Respiratory:   Normal respiratory effort without tachypnea/retractions. Breath sounds are clear and equal bilaterally. No wheezes/rales/rhonchi. Gastrointestinal:   Soft and nontender. Non distended.  There is no CVA tenderness.  No rebound, rigidity, or guarding. Genitourinary:   deferred Musculoskeletal:   Normal range of motion in all extremities. No joint effusions.  No lower extremity tenderness.  No edema. Neurologic:   Normal speech and language.  Motor grossly intact. No gross focal neurologic deficits are appreciated.  Skin:    Skin is warm, dry and intact. No rash noted.  No petechiae, purpura, or bullae.  ____________________________________________    LABS (pertinent positives/negatives) (all labs ordered are listed, but only abnormal results are displayed) Labs Reviewed  URINALYSIS, COMPLETE (UACMP) WITH MICROSCOPIC - Abnormal; Notable for the following:       Result Value   Color, Urine STRAW (*)    APPearance CLEAR (*)    Specific Gravity, Urine 1.003 (*)    Bacteria, UA RARE (*)    Squamous Epithelial / LPF 0-5 (*)    All other components within normal limits  BASIC METABOLIC PANEL  CBC  POCT PREGNANCY, URINE   ____________________________________________   EKG    ____________________________________________    RADIOLOGY  Ct Renal Stone Study  Result Date: 05/02/2017 CLINICAL DATA:  Pt reports left sided flank pain that started yesterday and is radiating down into abdomen. Pt denies dysuria or fever. No hx of kidney stones. EXAM: CT ABDOMEN AND PELVIS WITHOUT CONTRAST TECHNIQUE: Multidetector CT imaging of the abdomen and pelvis was performed following the standard protocol without IV contrast. COMPARISON:  09/06/2015 FINDINGS: Lower chest: Lung bases are clear. Hepatobiliary: No focal hepatic lesion. Postcholecystectomy. No biliary dilatation. Low-density cysts in the posterior RIGHT hepatic lobe. Pancreas: Pancreas is normal. No ductal dilatation. No pancreatic inflammation. Spleen: Normal spleen Adrenals/urinary tract: Adrenal glands are normal. No nephrolithiasis or ureterolithiasis. Multiple vascular calcifications in the pelvis. Bladder normal. No  bladder calculi identified. Along the lateral margin of the LEFT kidney there is a simple fluid density 18 mm lesion unchanged from comparison exam 2016 Stomach/Bowel: Stomach, small bowel, appendix, and cecum are normal. Several diverticular the descending colon sigmoid colon without acute inflammation. Rectum normal Vascular/Lymphatic: Abdominal aorta is normal caliber. There is no retroperitoneal or periportal lymphadenopathy. No pelvic lymphadenopathy. Reproductive: Uterus and ovaries normal Other: Small volume free fluid in the posterior cul-de-sac (image 70, series 2 Musculoskeletal: No aggressive osseous lesion. IMPRESSION: 1. No nephrolithiasis or ureterolithiasis. 2. Extensive LEFT colon sigmoid colon diverticulosis but no clear evidence acute diverticulitis. 3. Small volume of free fluid the pelvis with indeterminate etiology. Probable physiologic . 4. Cholecystectomy. Electronically Signed   By: Suzy Bouchard M.D.   On: 05/02/2017 10:48    ____________________________________________   PROCEDURES Procedures  ____________________________________________   INITIAL IMPRESSION / ASSESSMENT AND PLAN / ED COURSE  Pertinent labs & imaging results that were available during my care of the  patient were reviewed by me and considered in my medical decision making (see chart for details).  Patient well appearing no acute distress, normal vital signs, presents with left flank pain, concerning for renal colic. Given she does not have an established history of kidney stones, we'll check a CT renal stone protocol for further assessment, especially since labs and urinalysis are completely negative. She is tolerating oral intake, but there are no severe findings on CT I think the patient will be suitable for outpatient follow-up with pain control. I offered IM Toradol, she opts for oral medication.   ----------------------------------------- 12:40 PM on  05/02/2017 -----------------------------------------  Workup negative. Vital signs remained normal. Pain is much improved, 2 out of 10. Suitable for discharge home and outpatient follow-up. Patient will continue monitor symptoms, simple diet, NSAIDs.     ____________________________________________   FINAL CLINICAL IMPRESSION(S) / ED DIAGNOSES  Final diagnoses:  Left flank pain      New Prescriptions   KETOROLAC (TORADOL) 10 MG TABLET    Take 1 tablet (10 mg total) by mouth every 6 (six) hours as needed for moderate pain.     Portions of this note were generated with dragon dictation software. Dictation errors may occur despite best attempts at proofreading.    Carrie Mew, MD 05/02/17 1240

## 2017-05-02 NOTE — ED Notes (Signed)
Patient transported to CT at this time. 

## 2017-10-01 ENCOUNTER — Other Ambulatory Visit: Payer: Self-pay | Admitting: Obstetrics & Gynecology

## 2017-10-01 DIAGNOSIS — Z1231 Encounter for screening mammogram for malignant neoplasm of breast: Secondary | ICD-10-CM

## 2017-11-01 ENCOUNTER — Ambulatory Visit
Admission: RE | Admit: 2017-11-01 | Discharge: 2017-11-01 | Disposition: A | Discharge status: Home / Self Care | Payer: BLUE CROSS/BLUE SHIELD | Source: Ambulatory Visit | Attending: Obstetrics & Gynecology | Admitting: Obstetrics & Gynecology

## 2017-11-01 DIAGNOSIS — Z1231 Encounter for screening mammogram for malignant neoplasm of breast: Secondary | ICD-10-CM | POA: Diagnosis present

## 2017-11-01 HISTORY — DX: Personal history of irradiation: Z92.3

## 2018-09-15 ENCOUNTER — Other Ambulatory Visit: Payer: Self-pay | Admitting: Obstetrics & Gynecology

## 2018-09-15 DIAGNOSIS — Z1231 Encounter for screening mammogram for malignant neoplasm of breast: Secondary | ICD-10-CM

## 2018-11-03 ENCOUNTER — Ambulatory Visit
Admission: RE | Admit: 2018-11-03 | Discharge: 2018-11-03 | Disposition: A | Discharge status: Home / Self Care | Payer: BLUE CROSS/BLUE SHIELD | Source: Ambulatory Visit | Attending: Obstetrics & Gynecology | Admitting: Obstetrics & Gynecology

## 2018-11-03 DIAGNOSIS — Z1231 Encounter for screening mammogram for malignant neoplasm of breast: Secondary | ICD-10-CM | POA: Insufficient documentation

## 2019-05-26 DIAGNOSIS — R5383 Other fatigue: Secondary | ICD-10-CM | POA: Diagnosis not present

## 2019-05-26 DIAGNOSIS — Z0000 Encounter for general adult medical examination without abnormal findings: Secondary | ICD-10-CM | POA: Diagnosis not present

## 2019-05-26 DIAGNOSIS — Z01419 Encounter for gynecological examination (general) (routine) without abnormal findings: Secondary | ICD-10-CM | POA: Diagnosis not present

## 2019-05-26 DIAGNOSIS — Z1211 Encounter for screening for malignant neoplasm of colon: Secondary | ICD-10-CM | POA: Diagnosis not present

## 2019-07-20 DIAGNOSIS — H5203 Hypermetropia, bilateral: Secondary | ICD-10-CM | POA: Diagnosis not present

## 2019-09-17 DIAGNOSIS — R300 Dysuria: Secondary | ICD-10-CM | POA: Diagnosis not present

## 2019-09-25 ENCOUNTER — Other Ambulatory Visit: Payer: Self-pay | Admitting: Obstetrics & Gynecology

## 2019-09-25 ENCOUNTER — Ambulatory Visit: Payer: 59 | Attending: Internal Medicine

## 2019-09-25 DIAGNOSIS — Z20822 Contact with and (suspected) exposure to covid-19: Secondary | ICD-10-CM | POA: Insufficient documentation

## 2019-09-25 DIAGNOSIS — Z1231 Encounter for screening mammogram for malignant neoplasm of breast: Secondary | ICD-10-CM

## 2019-09-26 LAB — NOVEL CORONAVIRUS, NAA: SARS-CoV-2, NAA: NOT DETECTED

## 2019-10-07 DIAGNOSIS — Z0000 Encounter for general adult medical examination without abnormal findings: Secondary | ICD-10-CM | POA: Diagnosis not present

## 2019-11-05 ENCOUNTER — Ambulatory Visit
Admission: RE | Admit: 2019-11-05 | Discharge: 2019-11-05 | Disposition: A | Discharge status: Home / Self Care | Payer: 59 | Source: Ambulatory Visit | Attending: Obstetrics & Gynecology | Admitting: Obstetrics & Gynecology

## 2019-11-05 ENCOUNTER — Other Ambulatory Visit: Payer: Self-pay

## 2019-11-05 DIAGNOSIS — Z1231 Encounter for screening mammogram for malignant neoplasm of breast: Secondary | ICD-10-CM | POA: Insufficient documentation

## 2019-11-05 DIAGNOSIS — R300 Dysuria: Secondary | ICD-10-CM | POA: Diagnosis not present

## 2019-11-06 ENCOUNTER — Other Ambulatory Visit: Payer: Self-pay | Admitting: Obstetrics & Gynecology

## 2019-11-06 DIAGNOSIS — R928 Other abnormal and inconclusive findings on diagnostic imaging of breast: Secondary | ICD-10-CM

## 2019-11-06 DIAGNOSIS — N6320 Unspecified lump in the left breast, unspecified quadrant: Secondary | ICD-10-CM

## 2019-11-17 DIAGNOSIS — D2261 Melanocytic nevi of right upper limb, including shoulder: Secondary | ICD-10-CM | POA: Diagnosis not present

## 2019-11-17 DIAGNOSIS — D225 Melanocytic nevi of trunk: Secondary | ICD-10-CM | POA: Diagnosis not present

## 2019-11-17 DIAGNOSIS — D2262 Melanocytic nevi of left upper limb, including shoulder: Secondary | ICD-10-CM | POA: Diagnosis not present

## 2019-11-17 DIAGNOSIS — L820 Inflamed seborrheic keratosis: Secondary | ICD-10-CM | POA: Diagnosis not present

## 2019-11-17 DIAGNOSIS — D2272 Melanocytic nevi of left lower limb, including hip: Secondary | ICD-10-CM | POA: Diagnosis not present

## 2019-11-17 DIAGNOSIS — D2271 Melanocytic nevi of right lower limb, including hip: Secondary | ICD-10-CM | POA: Diagnosis not present

## 2019-11-17 DIAGNOSIS — L821 Other seborrheic keratosis: Secondary | ICD-10-CM | POA: Diagnosis not present

## 2019-11-17 DIAGNOSIS — L538 Other specified erythematous conditions: Secondary | ICD-10-CM | POA: Diagnosis not present

## 2019-11-23 ENCOUNTER — Ambulatory Visit
Admission: RE | Admit: 2019-11-23 | Discharge: 2019-11-23 | Disposition: A | Discharge status: Home / Self Care | Payer: 59 | Source: Ambulatory Visit | Attending: Obstetrics & Gynecology | Admitting: Obstetrics & Gynecology

## 2019-11-23 DIAGNOSIS — Z853 Personal history of malignant neoplasm of breast: Secondary | ICD-10-CM | POA: Diagnosis not present

## 2019-11-23 DIAGNOSIS — N6320 Unspecified lump in the left breast, unspecified quadrant: Secondary | ICD-10-CM

## 2019-11-23 DIAGNOSIS — R928 Other abnormal and inconclusive findings on diagnostic imaging of breast: Secondary | ICD-10-CM

## 2019-11-23 DIAGNOSIS — R922 Inconclusive mammogram: Secondary | ICD-10-CM | POA: Diagnosis not present

## 2019-11-23 DIAGNOSIS — N6489 Other specified disorders of breast: Secondary | ICD-10-CM | POA: Diagnosis not present

## 2019-11-24 ENCOUNTER — Other Ambulatory Visit: Payer: Self-pay | Admitting: Obstetrics & Gynecology

## 2019-11-24 DIAGNOSIS — R928 Other abnormal and inconclusive findings on diagnostic imaging of breast: Secondary | ICD-10-CM

## 2020-01-05 DIAGNOSIS — R300 Dysuria: Secondary | ICD-10-CM | POA: Diagnosis not present

## 2020-01-28 DIAGNOSIS — Z853 Personal history of malignant neoplasm of breast: Secondary | ICD-10-CM | POA: Diagnosis not present

## 2020-04-12 ENCOUNTER — Other Ambulatory Visit: Payer: Self-pay | Admitting: General Surgery

## 2020-04-12 DIAGNOSIS — R928 Other abnormal and inconclusive findings on diagnostic imaging of breast: Secondary | ICD-10-CM

## 2020-04-21 DIAGNOSIS — Z03818 Encounter for observation for suspected exposure to other biological agents ruled out: Secondary | ICD-10-CM | POA: Diagnosis not present

## 2020-05-26 ENCOUNTER — Other Ambulatory Visit: Payer: 59

## 2020-05-27 ENCOUNTER — Other Ambulatory Visit: Payer: 59

## 2020-05-30 ENCOUNTER — Other Ambulatory Visit: Payer: 59

## 2020-05-31 ENCOUNTER — Ambulatory Visit
Admission: RE | Admit: 2020-05-31 | Discharge: 2020-05-31 | Disposition: A | Discharge status: Home / Self Care | Payer: 59 | Source: Ambulatory Visit | Attending: Obstetrics & Gynecology | Admitting: Obstetrics & Gynecology

## 2020-05-31 ENCOUNTER — Ambulatory Visit
Admission: RE | Admit: 2020-05-31 | Discharge: 2020-05-31 | Disposition: A | Discharge status: Home / Self Care | Payer: 59 | Source: Ambulatory Visit | Attending: General Surgery | Admitting: General Surgery

## 2020-05-31 DIAGNOSIS — N6489 Other specified disorders of breast: Secondary | ICD-10-CM | POA: Diagnosis not present

## 2020-05-31 DIAGNOSIS — R928 Other abnormal and inconclusive findings on diagnostic imaging of breast: Secondary | ICD-10-CM | POA: Diagnosis not present

## 2020-05-31 DIAGNOSIS — R922 Inconclusive mammogram: Secondary | ICD-10-CM | POA: Diagnosis not present

## 2020-06-01 DIAGNOSIS — Z1231 Encounter for screening mammogram for malignant neoplasm of breast: Secondary | ICD-10-CM | POA: Diagnosis not present

## 2020-06-01 DIAGNOSIS — R002 Palpitations: Secondary | ICD-10-CM | POA: Diagnosis not present

## 2020-06-01 DIAGNOSIS — Z0000 Encounter for general adult medical examination without abnormal findings: Secondary | ICD-10-CM | POA: Diagnosis not present

## 2020-06-01 DIAGNOSIS — R5383 Other fatigue: Secondary | ICD-10-CM | POA: Diagnosis not present

## 2020-06-01 DIAGNOSIS — Z1211 Encounter for screening for malignant neoplasm of colon: Secondary | ICD-10-CM | POA: Diagnosis not present

## 2020-06-01 DIAGNOSIS — Z01419 Encounter for gynecological examination (general) (routine) without abnormal findings: Secondary | ICD-10-CM | POA: Diagnosis not present

## 2020-06-01 DIAGNOSIS — E559 Vitamin D deficiency, unspecified: Secondary | ICD-10-CM | POA: Diagnosis not present

## 2020-06-01 DIAGNOSIS — Z1331 Encounter for screening for depression: Secondary | ICD-10-CM | POA: Diagnosis not present

## 2020-06-02 DIAGNOSIS — Z853 Personal history of malignant neoplasm of breast: Secondary | ICD-10-CM | POA: Diagnosis not present

## 2020-07-05 ENCOUNTER — Other Ambulatory Visit: Payer: Self-pay | Admitting: General Surgery

## 2020-07-05 NOTE — Progress Notes (Signed)
Subjective:     Patient ID: Jamie Wu is a 49 y.o. female.  HPI  The following portions of the patient's history were reviewed and updated as appropriate.  This an established patient is here today for: office visit. Here for her follow up mammogram 05-31-20. She states she is due for her colonoscopy.  She denies any new breast issues. She does admit to bilateral axilla red knots that come and go since she changed deodorant in July. She admits to having left chest wall pain sometimes with shortness of breath and sometimes not. This has been going on maybe a year for a couple times a month.   Review of Systems  Constitutional: Negative for chills and fever.  Respiratory: Negative for cough.         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 T1, N0, M0, histologic grade 1.BRCA negative by patient report.           Past Surgical History:  Procedure Laterality Date   breast biopsy Left 2008   CHOLECYSTECTOMY  1998   INCISIONAL BIOPSY BREAST Right 2013   MASTECTOMY PARTIAL / LUMPECTOMY Left 05/2007              OB History    Gravida  4   Para  3   Term  3   Preterm      AB  1   Living  3     SAB      TAB  1   Ectopic      Molar      Multiple      Live Births  3       Obstetric Comments  Age at first period 58 Age of first pregnancy 68         Social History          Socioeconomic History   Marital status: Married    Spouse name: Not on file   Number of  children: 3   Years of education: Not on file   Highest education level: Not on file  Occupational History   Occupation: Homemaker  Tobacco Use   Smoking status: Never Smoker   Smokeless tobacco: Never Used  Scientific laboratory technician Use: Never used  Substance and Sexual Activity   Alcohol use: Yes    Comment: Occasional   Drug use: No   Sexual activity: Yes    Partners: Male    Birth control/protection: None    Comment: Husband had vasectomy  Other Topics Concern   Not on file  Social History Narrative   Not on file   Social Determinants of Health      Financial Resource Strain:    Difficulty of Paying Living Expenses:   Food Insecurity:    Worried About Estate manager/land agent of Food in the Last Year:  Ran Out of Food in the Last Year:   Transportation Needs:    Film/video editor (Medical):    Lack of Transportation (Non-Medical):             Allergies  Allergen Reactions   Iodinated Contrast Media Hives    Pt get hives, itching and sneezing.    Sulfa (Sulfonamide Antibiotics) Hives    Current Medications        Current Outpatient Medications  Medication Sig Dispense Refill   ascorbic acid, vitamin C, (VITAMIN C) 1000 MG tablet Take 1,000 mg by mouth once daily     cholecalciferol (CHOLECALCIFEROL) 1000 unit tablet Take by mouth once daily     zinc citrate-phytase (ZYTAZE) 25-500 mg capsule Take by mouth     No current facility-administered medications for this visit.           Family History  Problem Relation Age of Onset   High blood pressure (Hypertension) Mother    High blood pressure (Hypertension) Father    Alzheimer's disease Father    Prostate cancer Father    High blood pressure (Hypertension) Sister    Colon cancer Paternal Uncle    Myocardial Infarction (Heart attack) Paternal Uncle    Diabetes Son         Objective:   Physical Exam Exam conducted with a chaperone present.   Constitutional:      Appearance: Normal appearance.  Cardiovascular:     Rate and Rhythm: Normal rate and regular rhythm.     Pulses: Normal pulses.     Heart sounds: Normal heart sounds.  Pulmonary:     Effort: Pulmonary effort is normal.     Breath sounds: Normal breath sounds.  Chest:     Breasts:        Right: Normal.        Left: Normal.       Comments: Left lumpectomy  Axillary rash: Less than 1 mm, raised colorless papules. Musculoskeletal:     Cervical back: Neck supple.  Lymphadenopathy:     Upper Body:     Right upper body: No supraclavicular or axillary adenopathy.     Left upper body: No supraclavicular or axillary adenopathy.  Skin:    General: Skin is warm and dry.  Neurological:     Mental Status: She is alert and oriented to person, place, and time.  Psychiatric:        Mood and Affect: Mood normal.        Behavior: Behavior normal.    Labs and Radiology:   Mammograms from November 05, 2019 through May 31, 2020 were independently reviewed. Postsurgical changes in the left breast.  We will plan for diagnostic mammogram in March 2022, and if no interval change during that year follow-up, return to screening mammograms the following year    Assessment:     No evidence of recurrent breast cancer.  Candidate for colon cancer screening.    Plan:     Options for colon cancer screening were reviewed: 1) Cologuard versus 2) colonoscopy. Pros and cons of each were reviewed as well as screening intervals. Detection rates reviewed. Patient will consider her options and notify the office if she would like to proceed in either direction.  We will plan on getting together in 6 months with bilateral diagnostic mammograms. If no interval change in the area of radiology concern will return to screening mammograms the following year.    Entered by Karie Fetch, RN, acting as a  scribe for Dr. Hervey Ard, MD.  The documentation recorded by the scribe  accurately reflects the service I personally performed and the decisions made by me.   Robert Bellow, MD FACS     Electronically signed by Mayer Masker, MD on 06/03/2020 7:13 AM    Office Visit on 06/02/2020   Office Visit on 06/02/2020     Revision & Routing History    Note shared with patient   Department  Name Address Phone [facility] [place] [id number] [phone]   Service Location  Name Address    [address]  [place] [address]

## 2020-07-27 ENCOUNTER — Other Ambulatory Visit
Admission: RE | Admit: 2020-07-27 | Discharge: 2020-07-27 | Disposition: A | Discharge status: Home / Self Care | Payer: 59 | Source: Ambulatory Visit | Attending: General Surgery | Admitting: General Surgery

## 2020-07-27 ENCOUNTER — Other Ambulatory Visit: Payer: Self-pay

## 2020-07-27 DIAGNOSIS — Z20822 Contact with and (suspected) exposure to covid-19: Secondary | ICD-10-CM | POA: Diagnosis not present

## 2020-07-27 DIAGNOSIS — Z01812 Encounter for preprocedural laboratory examination: Secondary | ICD-10-CM | POA: Insufficient documentation

## 2020-07-27 LAB — SARS CORONAVIRUS 2 (TAT 6-24 HRS): SARS Coronavirus 2: NEGATIVE

## 2020-07-29 ENCOUNTER — Encounter: Payer: Self-pay | Admitting: General Surgery

## 2020-07-29 ENCOUNTER — Ambulatory Visit: Payer: 59 | Admitting: Certified Registered Nurse Anesthetist

## 2020-07-29 ENCOUNTER — Ambulatory Visit
Admission: RE | Admit: 2020-07-29 | Discharge: 2020-07-29 | Disposition: A | Discharge status: Home / Self Care | Payer: 59 | Attending: General Surgery | Admitting: General Surgery

## 2020-07-29 ENCOUNTER — Other Ambulatory Visit: Payer: Self-pay

## 2020-07-29 ENCOUNTER — Encounter
Admission: RE | Disposition: A | Discharge status: Home / Self Care | Payer: Self-pay | Source: Home / Self Care | Attending: General Surgery

## 2020-07-29 DIAGNOSIS — Z87891 Personal history of nicotine dependence: Secondary | ICD-10-CM | POA: Diagnosis not present

## 2020-07-29 DIAGNOSIS — K5730 Diverticulosis of large intestine without perforation or abscess without bleeding: Secondary | ICD-10-CM | POA: Diagnosis not present

## 2020-07-29 DIAGNOSIS — Z1211 Encounter for screening for malignant neoplasm of colon: Secondary | ICD-10-CM | POA: Insufficient documentation

## 2020-07-29 DIAGNOSIS — Z923 Personal history of irradiation: Secondary | ICD-10-CM | POA: Insufficient documentation

## 2020-07-29 DIAGNOSIS — Z853 Personal history of malignant neoplasm of breast: Secondary | ICD-10-CM | POA: Diagnosis not present

## 2020-07-29 DIAGNOSIS — K5790 Diverticulosis of intestine, part unspecified, without perforation or abscess without bleeding: Secondary | ICD-10-CM | POA: Diagnosis not present

## 2020-07-29 HISTORY — PX: COLONOSCOPY WITH PROPOFOL: SHX5780

## 2020-07-29 LAB — POCT PREGNANCY, URINE: Preg Test, Ur: NEGATIVE

## 2020-07-29 SURGERY — COLONOSCOPY WITH PROPOFOL
Anesthesia: General

## 2020-07-29 MED ORDER — LIDOCAINE HCL (PF) 2 % IJ SOLN
INTRAMUSCULAR | Status: AC
  Filled 2020-07-29: qty 5

## 2020-07-29 MED ORDER — PROPOFOL 500 MG/50ML IV EMUL
INTRAVENOUS | Status: AC
  Filled 2020-07-29: qty 50

## 2020-07-29 MED ORDER — LIDOCAINE HCL (CARDIAC) PF 100 MG/5ML IV SOSY
PREFILLED_SYRINGE | INTRAVENOUS | Status: DC | PRN
  Administered 2020-07-29: 50 mg via INTRAVENOUS

## 2020-07-29 MED ORDER — PROPOFOL 10 MG/ML IV BOLUS
INTRAVENOUS | Status: DC | PRN
  Administered 2020-07-29: 70 mg via INTRAVENOUS

## 2020-07-29 MED ORDER — SODIUM CHLORIDE 0.9 % IV SOLN
INTRAVENOUS | Status: DC
  Administered 2020-07-29: 20 mL/h via INTRAVENOUS

## 2020-07-29 MED ORDER — PROPOFOL 500 MG/50ML IV EMUL
INTRAVENOUS | Status: DC | PRN
  Administered 2020-07-29: 175 ug/kg/min via INTRAVENOUS

## 2020-07-29 NOTE — H&P (Signed)
Jamie Wu [id number] [birth year]     HPI:  Healthy 49 y/o for first screening colon exam.  Tolerated prep well. Had originally planned to do Cologuard, but a close friend had colon cancer and she decided to do a colonoscopy.   Medications Prior to Admission  Medication Sig Dispense Refill Last Dose  . Ascorbic Acid (VITAMIN C) 1000 MG tablet Take 1,000 mg by mouth daily.   Past Week at Unknown time  . Cholecalciferol 25 MCG (1000 UT) capsule Take 1,000 Units by mouth daily.   Past Week at Unknown time  . Zinc Citrate-Phytase 25-500 MG CAPS Take by mouth.   Past Week at Unknown time  . ketorolac (TORADOL) 10 MG tablet Take 1 tablet (10 mg total) by mouth every 6 (six) hours as needed for moderate pain. 12 tablet 0    Allergies  Allergen Reactions  . Contrast Media [Iodinated Diagnostic Agents] Hives    Pt get hives, itching and sneezing.   . Sulfa Antibiotics Hives   Past Medical History:  Diagnosis Date  . Breast cancer ([place])   . 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 T1, N0, M0, histologic grade 1, BRCA negative by patient report.  . Personal history of malignant neoplasm of breast 2008   L LUMPECTOMY  . Personal history of radiation therapy 2008   LEFT lumpectomy 2008   Past Surgical History:  Procedure Laterality Date  . BREAST BIOPSY Right June 25, 2012   MRI abnormality showing fibrocystic changes with microcalcifications.  Marland Kitchen BREAST EXCISIONAL BIOPSY Left 2008   breast ca  . BREAST LUMPECTOMY Left 2008  . CHOLECYSTECTOMY  1998   Social History   Socioeconomic History  . Marital status: Married    Spouse name: Not on file  . Number of children: Not on file  . Years of education: Not on file  . Highest education level: Not on file  Occupational History  . Not on file  Tobacco Use  . Smoking status: Former Smoker    Packs/day: 1.00    Years: 3.00    Pack years: 3.00     Quit date: 09/10/1993    Years since quitting: 26.9  . Smokeless tobacco: Never Used  Substance and Sexual Activity  . Alcohol use: Yes    Alcohol/week: 0.0 standard drinks    Comment: occas-wine  . Drug use: No  . Sexual activity: Not on file  Other Topics Concern  . Not on file  Social History Narrative  . Not on file   Social Determinants of Health   Financial Resource Strain:   . Difficulty of Paying Living Expenses: Not on file  Food Insecurity:   . Worried About Charity fundraiser in the Last Year: Not on file  . Ran Out of Food in the Last Year: Not on file  Transportation Needs:   . Lack of Transportation (Medical): Not on file  . Lack of Transportation (Non-Medical): Not on file  Physical Activity:   . Days of Exercise per Week: Not on file  . Minutes of Exercise per Session: Not on file  Stress:   . Feeling of Stress : Not on file  Social Connections:   . Frequency of Communication with Friends and Family: Not on file  . Frequency of Social Gatherings with Friends and Family: Not on file  . Attends Religious Services: Not on file  . Active  Member of Clubs or Organizations: Not on file  . Attends Archivist Meetings: Not on file  . Marital Status: Not on file  Intimate Partner Violence:   . Fear of Current or Ex-Partner: Not on file  . Emotionally Abused: Not on file  . Physically Abused: Not on file  . Sexually Abused: Not on file   Social History   Social History Narrative  . Not on file     ROS: Negative.     PE: HEENT: Negative. Lungs: Clear. Cardio: RR.  Assessment/Plan:  Proceed with planned endoscopy.   [name] 07/29/2020

## 2020-07-29 NOTE — Transfer of Care (Signed)
Immediate Anesthesia Transfer of Care Note  Patient: Jamie Wu  Procedure(s) Performed: COLONOSCOPY WITH PROPOFOL (N/A )  Patient Location: PACU  Anesthesia Type:General  Level of Consciousness: sedated  Airway & Oxygen Therapy: Patient Spontanous Breathing and Patient connected to nasal cannula oxygen  Post-op Assessment: Report given to RN and Post -op Vital signs reviewed and stable  Post vital signs: Reviewed and stable  Last Vitals:  Vitals Value Taken Time  BP 112/64 07/29/20 0804  Temp    Pulse 93 07/29/20 0804  Resp 15 07/29/20 0804  SpO2 97 % 07/29/20 0804    Last Pain:  Vitals:   07/29/20 0657  TempSrc: Temporal  PainSc: 0-No pain         Complications: No complications documented.

## 2020-07-29 NOTE — Progress Notes (Signed)
   07/29/20 0738  Clinical Encounter Type  Visited With Family  Visit Type Initial  Referral From Chaplain  Consult/Referral To [place] visited with Pt's husband while rounding SDS waiting area to find out how he was doing. Pt is having a colonoscopy. They engaged in a nice conversation about what chaplains do. Pt's husband says his wife needs to do what chaplain is doing. Chaplain told him she graduated from [employer] and his wife might want to look into [place]. 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 Before chaplain left, Pt's husband asked if he could hug her.

## 2020-07-29 NOTE — Anesthesia Procedure Notes (Signed)
Date/Time: 07/29/2020 7:33 AM Performed by: Johnna Acosta, CRNA Pre-anesthesia Checklist: Patient identified, Emergency Drugs available, Suction available, Patient being monitored and Timeout performed Patient Re-evaluated:Patient Re-evaluated prior to induction Oxygen Delivery Method: Nasal cannula Preoxygenation: Pre-oxygenation with 100% oxygen Induction Type: IV induction

## 2020-07-29 NOTE — Op Note (Signed)
[facility] Patient Name: Jamie Wu Procedure Date: 07/29/2020 7:28 AM MRN: [id number] Account #: [id number] Date of Birth: 1971-07-19 Admit Type: Outpatient Age: 49 Room: [facility] ENDO ROOM 1 Gender: Female Note Status: Finalized Procedure:             Colonoscopy Indications:           Screening for colorectal malignant neoplasm Providers:             Robert Bellow, MD Medicines:             Monitored Anesthesia Care Complications:         No immediate complications. Procedure:             Pre-Anesthesia Assessment:                        - Prior to the procedure, a History and Physical was                         performed, and patient medications, allergies and                         sensitivities were reviewed. The patient's tolerance                         of previous anesthesia was reviewed.                        - The risks and benefits of the procedure and the                         sedation options and risks were discussed with the                         patient. All questions were answered and informed                         consent was obtained.                        After obtaining informed consent, the colonoscope was                         passed under direct vision. Throughout the procedure,                         the patient's blood pressure, pulse, and oxygen                         saturations were monitored continuously. The                         Colonoscope was introduced through the anus and                         advanced to the the cecum, identified by appendiceal                         orifice and ileocecal valve. The colonoscopy was  somewhat difficult due to multiple diverticula in the                         colon and restricted mobility of the colon. Successful                         completion of the procedure was aided by using manual                         pressure. The  patient tolerated the procedure well.                         The quality of the bowel preparation was excellent. Findings:      Multiple medium-mouthed diverticula were found in the sigmoid colon,       descending colon, transverse colon and ascending colon.      The retroflexed view of the distal rectum and anal verge was normal and       showed no anal or rectal abnormalities. Impression:            - Diverticulosis in the sigmoid colon, in the                         descending colon, in the transverse colon and in the                         ascending colon.                        - The distal rectum and anal verge are normal on                         retroflexion view.                        - No specimens collected. Recommendation:        - High fiber diet indefinitely.                        - Repeat colonoscopy in 10 years for screening                         purposes. Procedure Code(s):     --- Professional ---                        [phone], Colonoscopy, flexible; diagnostic, including                         collection of specimen(s) by brushing or washing, when                         performed (separate procedure) Diagnosis Code(s):     --- Professional ---                        K57.30, Diverticulosis of large intestine without                         perforation or abscess without bleeding  Z12.11, Encounter for screening for malignant neoplasm                         of colon CPT copyright 2019 American Medical Association. All rights reserved. The codes documented in this report are preliminary and upon coder review may  be revised to meet current compliance requirements. Robert Bellow, MD 07/29/2020 8:00:54 AM This report has been signed electronically. Number of Addenda: 0 Note Initiated On: 07/29/2020 7:28 AM Scope Withdrawal Time: 0 hours 10 minutes 8 seconds  Total Procedure Duration: 0 hours 21 minutes 35 seconds  Estimated Blood  Loss:  Estimated blood loss: none.      [facility]

## 2020-08-01 NOTE — Anesthesia Preprocedure Evaluation (Signed)
Anesthesia Evaluation  Patient identified by MRN, date of birth, ID band Patient awake    Reviewed: Allergy & Precautions, H&P , NPO status , Patient's Chart, lab work & pertinent test results  History of Anesthesia Complications Negative for: history of anesthetic complications  Airway Mallampati: II  TM Distance: >3 FB     Dental  (+) Teeth Intact   Pulmonary neg sleep apnea, neg COPD, former smoker,    breath sounds clear to auscultation       Cardiovascular (-) angina(-) Past MI and (-) Cardiac Stents negative cardio ROS  (-) dysrhythmias  Rhythm:regular Rate:Normal     Neuro/Psych negative neurological ROS  negative psych ROS   GI/Hepatic negative GI ROS, Neg liver ROS,   Endo/Other  negative endocrine ROS  Renal/GU negative Renal ROS  negative genitourinary   Musculoskeletal   Abdominal   Peds  Hematology negative hematology ROS (+)   Anesthesia Other Findings Past Medical History: No date: Breast cancer ([place]) 2008: Cancer ([place])     Comment:  left breast less than 10 mm 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               T1, N0, M0, histologic grade 1, BRCA negative by patient               report. 2008: Personal history of malignant neoplasm of breast     Comment:  L LUMPECTOMY 2008: Personal history of radiation therapy     Comment:  LEFT lumpectomy 2008  Past Surgical History: June 25, 2012: BREAST BIOPSY; Right     Comment:  MRI abnormality showing fibrocystic changes with               microcalcifications. 2008: BREAST EXCISIONAL BIOPSY; Left     Comment:  breast ca 2008: BREAST LUMPECTOMY; Left 1998: CHOLECYSTECTOMY 07/29/2020: COLONOSCOPY WITH PROPOFOL; N/A     Comment:  Procedure: COLONOSCOPY WITH PROPOFOL;  [name], MD;  [name];  Service:               Endoscopy;  Laterality:  N/A;  BMI    Body Mass Index: 22.15 kg/m      Reproductive/Obstetrics negative OB ROS                             Anesthesia Physical Anesthesia Plan  ASA: II  Anesthesia Plan: General   Post-op Pain Management:    Induction:   PONV Risk Score and Plan: Propofol infusion and TIVA  Airway Management Planned:   Additional Equipment:   Intra-op Plan:   Post-operative Plan:   Informed Consent: I have reviewed the patients History and Physical, chart, labs and discussed the procedure including the risks, benefits and alternatives for the proposed anesthesia with the patient or authorized representative who has indicated his/her understanding and acceptance.     Dental Advisory Given  Plan Discussed with: Anesthesiologist, CRNA and Surgeon  Anesthesia Plan Comments:         Anesthesia Quick Evaluation

## 2020-08-01 NOTE — Anesthesia Postprocedure Evaluation (Signed)
Anesthesia Post Note  Patient: Jamie Wu  Procedure(s) Performed: COLONOSCOPY WITH PROPOFOL (N/A )  Patient location during evaluation: PACU Anesthesia Type: General Level of consciousness: awake and alert Pain management: pain level controlled Vital Signs Assessment: post-procedure vital signs reviewed and stable Respiratory status: spontaneous breathing, nonlabored ventilation and respiratory function stable Cardiovascular status: blood pressure returned to baseline and stable Postop Assessment: no apparent nausea or vomiting Anesthetic complications: no   No complications documented.   Last Vitals:  Vitals:   07/29/20 0820 07/29/20 0825  BP:  137/82  Pulse: 79 70  Resp: 15 16  Temp:    SpO2: 100% 99%    Last Pain:  Vitals:   07/30/20 0816  TempSrc:   PainSc: 0-No pain                 Tera Mater

## 2020-08-29 DIAGNOSIS — H5203 Hypermetropia, bilateral: Secondary | ICD-10-CM | POA: Diagnosis not present

## 2020-09-07 ENCOUNTER — Ambulatory Visit
Admission: RE | Admit: 2020-09-07 | Discharge: 2020-09-07 | Disposition: A | Discharge status: Home / Self Care | Payer: 59 | Attending: Emergency Medicine | Admitting: Emergency Medicine

## 2020-09-07 ENCOUNTER — Other Ambulatory Visit: Payer: Self-pay | Admitting: Emergency Medicine

## 2020-09-07 ENCOUNTER — Other Ambulatory Visit: Payer: Self-pay

## 2020-09-07 ENCOUNTER — Ambulatory Visit
Admission: RE | Admit: 2020-09-07 | Discharge: 2020-09-07 | Disposition: A | Discharge status: Home / Self Care | Payer: 59 | Source: Ambulatory Visit | Attending: Emergency Medicine | Admitting: Emergency Medicine

## 2020-09-07 DIAGNOSIS — R059 Cough, unspecified: Secondary | ICD-10-CM | POA: Diagnosis not present

## 2020-09-07 DIAGNOSIS — J449 Chronic obstructive pulmonary disease, unspecified: Secondary | ICD-10-CM | POA: Diagnosis not present

## 2020-10-21 ENCOUNTER — Other Ambulatory Visit: Payer: Self-pay | Admitting: General Surgery

## 2020-10-21 DIAGNOSIS — Z853 Personal history of malignant neoplasm of breast: Secondary | ICD-10-CM

## 2020-11-30 ENCOUNTER — Ambulatory Visit
Admission: RE | Admit: 2020-11-30 | Discharge: 2020-11-30 | Disposition: A | Discharge status: Home / Self Care | Payer: 59 | Source: Ambulatory Visit | Attending: General Surgery | Admitting: General Surgery

## 2020-11-30 ENCOUNTER — Other Ambulatory Visit: Payer: Self-pay

## 2020-11-30 DIAGNOSIS — Z853 Personal history of malignant neoplasm of breast: Secondary | ICD-10-CM

## 2020-11-30 DIAGNOSIS — R922 Inconclusive mammogram: Secondary | ICD-10-CM | POA: Diagnosis not present

## 2020-11-30 DIAGNOSIS — N6489 Other specified disorders of breast: Secondary | ICD-10-CM | POA: Diagnosis not present

## 2020-12-16 DIAGNOSIS — R059 Cough, unspecified: Secondary | ICD-10-CM | POA: Diagnosis not present

## 2020-12-16 DIAGNOSIS — J90 Pleural effusion, not elsewhere classified: Secondary | ICD-10-CM | POA: Diagnosis not present

## 2020-12-16 DIAGNOSIS — R053 Chronic cough: Secondary | ICD-10-CM | POA: Diagnosis not present

## 2020-12-20 DIAGNOSIS — R0600 Dyspnea, unspecified: Secondary | ICD-10-CM | POA: Diagnosis not present

## 2020-12-20 DIAGNOSIS — Z853 Personal history of malignant neoplasm of breast: Secondary | ICD-10-CM | POA: Diagnosis not present

## 2020-12-21 DIAGNOSIS — U071 COVID-19: Secondary | ICD-10-CM | POA: Diagnosis not present

## 2020-12-21 DIAGNOSIS — R0600 Dyspnea, unspecified: Secondary | ICD-10-CM | POA: Diagnosis not present

## 2020-12-21 DIAGNOSIS — J1282 Pneumonia due to coronavirus disease 2019: Secondary | ICD-10-CM | POA: Diagnosis not present

## 2021-01-04 DIAGNOSIS — R0789 Other chest pain: Secondary | ICD-10-CM | POA: Diagnosis not present

## 2021-01-04 DIAGNOSIS — R0600 Dyspnea, unspecified: Secondary | ICD-10-CM | POA: Diagnosis not present

## 2021-01-04 DIAGNOSIS — U071 COVID-19: Secondary | ICD-10-CM | POA: Diagnosis not present

## 2021-01-04 DIAGNOSIS — K2100 Gastro-esophageal reflux disease with esophagitis, without bleeding: Secondary | ICD-10-CM | POA: Diagnosis not present

## 2021-01-04 DIAGNOSIS — E538 Deficiency of other specified B group vitamins: Secondary | ICD-10-CM | POA: Diagnosis not present

## 2021-01-05 ENCOUNTER — Other Ambulatory Visit: Payer: Self-pay | Admitting: Internal Medicine

## 2021-01-05 DIAGNOSIS — U071 COVID-19: Secondary | ICD-10-CM

## 2021-01-05 DIAGNOSIS — R0600 Dyspnea, unspecified: Secondary | ICD-10-CM

## 2021-01-05 DIAGNOSIS — R0609 Other forms of dyspnea: Secondary | ICD-10-CM

## 2021-01-06 ENCOUNTER — Telehealth: Payer: Self-pay

## 2021-01-06 ENCOUNTER — Other Ambulatory Visit: Payer: Self-pay | Admitting: Internal Medicine

## 2021-01-06 DIAGNOSIS — U071 COVID-19: Secondary | ICD-10-CM

## 2021-01-06 DIAGNOSIS — J1282 Pneumonia due to coronavirus disease 2019: Secondary | ICD-10-CM

## 2021-01-06 NOTE — Telephone Encounter (Signed)
Phone call to patient to review instructions for 13 hr prep for CT w/ contrast on 01/10/21 at 3:20 PM. Pt reports her ordering doctor had already prescribed her a prep and she had it on hand. Therefor, I will not be calling in an additional prep. Pt reports her Prescription is : 01/10/21 2:20 AM- 50mg  Prednisone 01/10/21 8:20 AM- 50mg  Prednisone 01/10/21 2:20 pm - 50mg  Prednisone and 50mg  Benadryl  Pt verbalized understanding of prescription. Pt also advised to have a driver the day of taking these medications as the Benadryl may cause drowsiness. Pt verbalized understanding.

## 2021-01-10 ENCOUNTER — Ambulatory Visit
Admission: RE | Admit: 2021-01-10 | Discharge: 2021-01-10 | Disposition: A | Discharge status: Home / Self Care | Payer: 59 | Source: Ambulatory Visit | Attending: Internal Medicine | Admitting: Internal Medicine

## 2021-01-10 DIAGNOSIS — U071 COVID-19: Secondary | ICD-10-CM

## 2021-01-10 DIAGNOSIS — J90 Pleural effusion, not elsewhere classified: Secondary | ICD-10-CM | POA: Diagnosis not present

## 2021-01-10 DIAGNOSIS — J189 Pneumonia, unspecified organism: Secondary | ICD-10-CM | POA: Diagnosis not present

## 2021-01-10 MED ORDER — IOPAMIDOL (ISOVUE-300) INJECTION 61%
75.0000 mL | Freq: Once | INTRAVENOUS | Status: AC | PRN
  Administered 2021-01-10: 75 mL via INTRAVENOUS

## 2021-01-11 DIAGNOSIS — J90 Pleural effusion, not elsewhere classified: Secondary | ICD-10-CM | POA: Diagnosis not present

## 2021-01-11 DIAGNOSIS — M5414 Radiculopathy, thoracic region: Secondary | ICD-10-CM | POA: Diagnosis not present

## 2021-01-11 DIAGNOSIS — Z853 Personal history of malignant neoplasm of breast: Secondary | ICD-10-CM | POA: Diagnosis not present

## 2021-01-12 ENCOUNTER — Other Ambulatory Visit: Payer: Self-pay | Admitting: Pulmonary Disease

## 2021-01-12 ENCOUNTER — Other Ambulatory Visit
Admission: RE | Admit: 2021-01-12 | Discharge: 2021-01-12 | Disposition: A | Discharge status: Home / Self Care | Payer: 59 | Source: Ambulatory Visit | Attending: Pulmonary Disease | Admitting: Pulmonary Disease

## 2021-01-12 ENCOUNTER — Other Ambulatory Visit: Payer: Self-pay

## 2021-01-12 ENCOUNTER — Other Ambulatory Visit (HOSPITAL_COMMUNITY): Payer: Self-pay | Admitting: Pulmonary Disease

## 2021-01-12 DIAGNOSIS — Z20822 Contact with and (suspected) exposure to covid-19: Secondary | ICD-10-CM | POA: Insufficient documentation

## 2021-01-12 DIAGNOSIS — Z03818 Encounter for observation for suspected exposure to other biological agents ruled out: Secondary | ICD-10-CM | POA: Diagnosis not present

## 2021-01-12 DIAGNOSIS — Z8719 Personal history of other diseases of the digestive system: Secondary | ICD-10-CM

## 2021-01-12 DIAGNOSIS — Z01812 Encounter for preprocedural laboratory examination: Secondary | ICD-10-CM | POA: Insufficient documentation

## 2021-01-12 DIAGNOSIS — R0602 Shortness of breath: Secondary | ICD-10-CM | POA: Diagnosis not present

## 2021-01-12 DIAGNOSIS — J90 Pleural effusion, not elsewhere classified: Secondary | ICD-10-CM

## 2021-01-13 ENCOUNTER — Other Ambulatory Visit: Payer: Self-pay | Admitting: Interventional Radiology

## 2021-01-13 ENCOUNTER — Ambulatory Visit
Admission: RE | Admit: 2021-01-13 | Discharge: 2021-01-13 | Disposition: A | Discharge status: Home / Self Care | Payer: 59 | Source: Ambulatory Visit | Attending: Interventional Radiology | Admitting: Interventional Radiology

## 2021-01-13 ENCOUNTER — Other Ambulatory Visit: Payer: Self-pay

## 2021-01-13 ENCOUNTER — Ambulatory Visit
Admission: RE | Admit: 2021-01-13 | Discharge: 2021-01-13 | Disposition: A | Discharge status: Home / Self Care | Payer: 59 | Source: Ambulatory Visit | Attending: Pulmonary Disease | Admitting: Pulmonary Disease

## 2021-01-13 ENCOUNTER — Other Ambulatory Visit: Payer: Self-pay | Admitting: Pulmonary Disease

## 2021-01-13 DIAGNOSIS — J90 Pleural effusion, not elsewhere classified: Secondary | ICD-10-CM

## 2021-01-13 DIAGNOSIS — R918 Other nonspecific abnormal finding of lung field: Secondary | ICD-10-CM | POA: Diagnosis not present

## 2021-01-13 DIAGNOSIS — Z9889 Other specified postprocedural states: Secondary | ICD-10-CM | POA: Insufficient documentation

## 2021-01-13 DIAGNOSIS — Z853 Personal history of malignant neoplasm of breast: Secondary | ICD-10-CM | POA: Diagnosis not present

## 2021-01-13 LAB — BODY FLUID CELL COUNT WITH DIFFERENTIAL
Eos, Fluid: 0 %
Lymphs, Fluid: 11 %
Monocyte-Macrophage-Serous Fluid: 69 %
Neutrophil Count, Fluid: 20 %
Total Nucleated Cell Count, Fluid: 1576 cu mm

## 2021-01-13 LAB — AMYLASE, PLEURAL OR PERITONEAL FLUID: Amylase, Fluid: 54 U/L

## 2021-01-13 LAB — LACTATE DEHYDROGENASE, PLEURAL OR PERITONEAL FLUID: LD, Fluid: 95 U/L — ABNORMAL HIGH (ref 3–23)

## 2021-01-13 LAB — PROTEIN, PLEURAL OR PERITONEAL FLUID: Total protein, fluid: 4.4 g/dL

## 2021-01-13 LAB — SARS CORONAVIRUS 2 (TAT 6-24 HRS): SARS Coronavirus 2: NEGATIVE

## 2021-01-13 LAB — GLUCOSE, PLEURAL OR PERITONEAL FLUID: Glucose, Fluid: 83 mg/dL

## 2021-01-13 NOTE — Procedures (Signed)
Interventional Radiology Procedure Note  Procedure: Image guided left thoracentesis  Complications: None  EBL: None Sample: ~1200cc fluid sent  Recommendations: - CXR now -follow up labs - DC now after CXR  - routine wound care  Signed,  Dulcy Fanny. Earleen Newport, DO

## 2021-01-13 NOTE — Progress Notes (Signed)
[place]  Telephone:([phone]) [phone] Fax:(336) [phone]  ID: Jamie Wu OB: 1971-08-26  MR#: [id number]  MOQ#:[id number]  Patient Care Team: Rusty Aus, [name], [name], [name], [name], MD as Consulting Physician (Family Medicine)  CHIEF COMPLAINT: History of breast cancer.  INTERVAL HISTORY: Patient is a 50 year old female who has a history of a stage I breast cancer in 2008.  She recently had increasing shortness of breath and underwent CT scan.  This revealed patchy groundglass septal thickening likely sequela of recent COVID infection.  Patient also noted to have pleural effusion.  She was also noted to have new sclerotic foci in the spine of unclear etiology.  Patient underwent thoracentesis, but no active malignancy was seen.  She currently feels well and is asymptomatic.  Her shortness of breath resolved after the thoracentesis.  She denies any pain.  She has no neurological plaints.  She is especially recent fevers.  She has good appetite and denies weight loss.  She has no chest pain, shortness of breath, cough, or hemoptysis.  She denies any nausea, vomiting, constipation, or diarrhea.  She has no urinary complaints.  Patient offers no further specific complaints today.  REVIEW OF SYSTEMS:   Review of Systems  Constitutional: Negative.  Negative for fever, malaise/fatigue and weight loss.  Respiratory: Negative.  Negative for cough, hemoptysis and shortness of breath.   Cardiovascular: Negative.  Negative for chest pain and leg swelling.  Gastrointestinal: Negative.  Negative for abdominal pain.  Genitourinary: Negative.  Negative for dysuria.  Musculoskeletal: Negative.  Negative for back pain.  Skin: Negative.  Negative for rash.  Neurological: Negative.  Negative for dizziness, focal weakness, weakness and headaches.  Psychiatric/Behavioral:  The patient is nervous/anxious.     As per HPI. Otherwise, a complete review of systems is negative.  PAST MEDICAL HISTORY: Past Medical History:  Diagnosis Date  . Breast cancer [facility]) 2008   left breast  . Personal history of malignant neoplasm of breast 2008   L LUMPECTOMY  . Personal history of radiation therapy 2008   LEFT lumpectomy 2008    PAST SURGICAL HISTORY: Past Surgical History:  Procedure Laterality Date  . BREAST BIOPSY Right June 25, 2012   MRI abnormality showing fibrocystic changes with microcalcifications.  Marland Kitchen BREAST EXCISIONAL BIOPSY Left 2008   breast ca  . BREAST LUMPECTOMY Left 2008  . CHOLECYSTECTOMY  1998  . COLONOSCOPY WITH PROPOFOL N/A 07/29/2020   Procedure: COLONOSCOPY WITH PROPOFOL;  [name], MD;  [name];  Service: Endoscopy;  [name];    FAMILY HISTORY: Family History  Problem Relation Age of Onset  . Colon cancer Paternal Uncle        lat3 50's   . Breast cancer Neg Hx     ADVANCED DIRECTIVES (Y/N):  N  HEALTH MAINTENANCE: Social History   Tobacco Use  . Smoking status: Former Smoker    Packs/day: 1.00    Years: 3.00    Pack years: 3.00    Quit date: 09/10/1993    Years since quitting: 27.3  . Smokeless tobacco: Never Used  Substance Use Topics  . Alcohol use: Yes    Alcohol/week: 0.0 standard drinks    Comment: occas-wine  . Drug use: No     Colonoscopy:  PAP:  Bone density:  Lipid panel:  Allergies  Allergen Reactions  . Contrast Media [Iodinated Diagnostic Agents]  Hives    Pt get hives, itching and sneezing.   . Sulfa Antibiotics Hives    Current Outpatient Medications  Medication Sig Dispense Refill  . Cholecalciferol 25 MCG (1000 UT) capsule Take 1,000 Units by mouth daily. (Patient not taking: Reported on 01/19/2021)     No current facility-administered medications for this visit.    OBJECTIVE: Vitals:   01/19/21 1113  BP: (!) 159/103  Pulse: 87  Temp:  97.7 F (36.5 C)  SpO2: 99%     Body mass index is 21.27 kg/m.    ECOG FS:0 - Asymptomatic  General: Well-developed, well-nourished, no acute distress. Eyes: Pink conjunctiva, anicteric sclera. HEENT: Normocephalic, moist mucous membranes. Lungs: No audible wheezing or coughing. Heart: Regular rate and rhythm. Abdomen: Soft, nontender, no obvious distention. Musculoskeletal: No edema, cyanosis, or clubbing. Neuro: Alert, answering all questions appropriately. Cranial nerves grossly intact. Skin: No rashes or petechiae noted. Psych: Normal affect. Lymphatics: No cervical, calvicular, axillary or inguinal LAD.   LAB RESULTS:  Lab Results  Component Value Date   NA 137 05/02/2017   K 3.6 05/02/2017   CL 105 05/02/2017   CO2 27 05/02/2017   GLUCOSE 88 05/02/2017   BUN 11 05/02/2017   CREATININE 0.65 05/02/2017   CALCIUM 9.1 05/02/2017   GFRNONAA >60 05/02/2017   GFRAA >60 05/02/2017    Lab Results  Component Value Date   WBC 6.0 05/02/2017   HGB 13.8 05/02/2017   HCT 40.2 05/02/2017   MCV 84.8 05/02/2017   PLT 210 05/02/2017     STUDIES: CT CHEST W CONTRAST  Addendum Date: 01/10/2021   ADDENDUM REPORT: 01/10/2021 18:11 ADDENDUM: In addition to the findings outlined in the initial report interlobular septal thickening raises the question of lymphangitic tumor particularly in the RIGHT lower lobe and in the RIGHT middle lobe, for instance on image 96 of series 8. These results will be called to the ordering clinician or representative by the Radiologist Assistant, and communication documented in the PACS or [employer]. Electronically Signed   By: Zetta Bills M.D.   On: 01/10/2021 18:11   Result Date: 01/10/2021 CLINICAL DATA:  Pneumonia due to COVID-19 infection in January. LEFT-sided chest pain and pain between ribs and iliac crest for 3 weeks, also with history of LEFT-sided breast cancer. 50 year old female EXAM: CT CHEST WITH CONTRAST TECHNIQUE: Multidetector CT  imaging of the chest was performed during intravenous contrast administration. CONTRAST:  47m ISOVUE-300 IOPAMIDOL (ISOVUE-300) INJECTION 61% COMPARISON:  September 09, 2007.  Abdominal imaging from 2018. FINDINGS: Cardiovascular: Normal caliber of the thoracic aorta. Heart size is normal. No pericardial effusion. Central pulmonary vasculature is unremarkable. Mediastinum/Nodes: Mildly patulous esophagus. Thoracic inlet structures are normal. Post LEFT-sided axillary dissection. No adenopathy in the chest. Mild infiltration of the fat in the LEFT superior mediastinum. Lungs/Pleura: Patchy areas of ground-glass and septal thickening with bandlike consolidative process over the LEFT heart border. Large dependent LEFT-sided pleural effusion. Small RIGHT-sided pleural effusion. Ground-glass and septal thickening most pronounced at the LEFT lung base. No frank bronchiectatic changes. Upper Abdomen: Suspected hepatic steatosis. Liver is incompletely imaged. Area of low attenuation in the posterior RIGHT hepatic lobe not present in 2008 but not significantly changed compared to the 2018. Post cholecystectomy. No acute upper abdominal process. Musculoskeletal: No acute musculoskeletal finding. New sclerotic focus at the T1 level (image 15/2) 9 mm New sclerotic focus go to end of sentence (image 84/4) measuring 12 mm in the T12 level. New sclerotic focus in the T8  level (image 103/4 8 mm. Other scattered subtle small foci in the spine IMPRESSION: 1. Patchy areas of ground-glass and septal thickening may represent sequela of previous COVID infection. Ongoing or recurrent infection is not excluded. No significant signs of post inflammatory fibrosis. 2. Large LEFT and small RIGHT-sided pleural effusion. Significance uncertain in this patient with history of breast cancer. No overt nodularity in the pleural space. Would also correlate with any heart failure or volume overload. 3. Mild mediastinal fat stranding may reflect post  treatment changes or could be related to ongoing inflammation in the chest. 4. Bandlike area of consolidative changes along the pleural surface in the LEFT upper lobe may be post infectious or inflammatory or related to prior radiation. Attention on follow-up. 5. New sclerotic foci in the spine, suspicious for metastatic disease given history of breast cancer, consider follow-up PET or bone scan as warranted. 6. Post LEFT-sided axillary dissection. 7. Aortic atherosclerosis. Electronically Signed: By: Zetta Bills M.D. On: 01/10/2021 16:09   DG Chest Port 1 View  Result Date: 01/13/2021 CLINICAL DATA:  50 year old female status post left-sided thoracentesis EXAM: PORTABLE CHEST 1 VIEW COMPARISON:  CT chest 01/10/2021, plain film 09/07/2020 FINDINGS: Cardiomediastinal silhouette within normal limits. No interlobular septal thickening. Mild reticulonodular opacities of the bilateral lungs, pattern which was seen on the prior CT chest. No pneumothorax. Blunting of the bilateral costophrenic angles on the frontal view with meniscus on the left. Surgical changes of the left chest wall. No acute displaced fracture. No confluent airspace disease IMPRESSION: No complicating features status post left-sided thoracentesis, with trace residual fluid on the left and the right. Reticulonodular opacities of the lungs, compatible with findings on recent chest CT, suggesting sequela atypical infection. Electronically Signed   By: Corrie Mckusick D.O.   On: 01/13/2021 10:58   US ABDOMEN LIMITED RUQ (LIVER/GB)  Result Date: 01/17/2021 CLINICAL DATA:  50 year old female with hepatomegaly. EXAM: ULTRASOUND ABDOMEN LIMITED RIGHT UPPER QUADRANT COMPARISON:  Chest CT dated 01/10/2021 and CT abdomen pelvis dated 05/02/2017. FINDINGS: Gallbladder: Cholecystectomy. Common bile duct: Diameter: 5 mm Liver: The liver demonstrates a normal echogenicity. There is a septated cyst or 2 adjacent cyst with combined dimensions of 1.7 x 1.5 x 2.8  cm in the right lobe of the liver. Portal vein is patent on color Doppler imaging with normal direction of blood flow towards the liver. Other: Trace right pleural effusion. IMPRESSION: 1. Cholecystectomy. 2. Right hepatic cyst. Electronically Signed   By: Anner Crete M.D.   On: 01/17/2021 23:21   US THORACENTESIS ASP PLEURAL SPACE W/IMG GUIDE  Result Date: 01/13/2021 INDICATION: 50 year old female with a history left-sided pleural effusion EXAM: ULTRASOUND GUIDED LEFT THORACENTESIS MEDICATIONS: None. COMPLICATIONS: None PROCEDURE: An ultrasound guided thoracentesis was thoroughly discussed with the patient and questions answered. The benefits, risks, alternatives and complications were also discussed. The patient understands and wishes to proceed with the procedure. Written consent was obtained. Ultrasound was performed to localize and mark an adequate pocket of fluid in the left chest. The area was then prepped and draped in the normal sterile fashion. 1% Lidocaine was used for local anesthesia. Under ultrasound guidance a 8 Fr Safe-T-Centesis catheter was introduced. Thoracentesis was performed. The catheter was removed and a dressing applied. FINDINGS: A total of approximately 1200 cc of thin amber fluid was removed. Samples were sent to the laboratory as requested by the clinical team. IMPRESSION: Status post image guided left-sided thoracentesis. Signed, Dulcy Fanny. Dellia Nims, [facility]  Radiology Electronically Signed   By: Corrie Mckusick D.O.   On: 01/13/2021 11:20    ASSESSMENT: History of breast cancer, pleural effusion.  PLAN:    1. History of breast cancer: Patient underwent lumpectomy in 2008 followed by [name].  By report she was ER/PR positive.  It is unclear if she took tamoxifen at that time.  Patient also underwent genetic testing and was reported to be BRCA negative.  Given the low stage and grade of malignancy, this would be  highly unlikely to metastasize 14 years later, but will get tumor marker for completeness.  Plan to repeat CT scan chest in 3 months to assess for interval changes.  No further event intervention is needed.  Return to clinic after CT to discuss the results. 2.  Genetic: By report patient is BRCA negative.  Will refer back to genetics to see if additional genetic testing is warranted 14 years later.  I spent a total of 60 minutes reviewing chart data, face-to-face evaluation with the patient, counseling and coordination of care as detailed above.   Patient expressed understanding and was in agreement with this plan. She also understands that She can call clinic at any time with any questions, concerns, or complaints.   Cancer Staging No matching staging information was found for the patient.  Lloyd Huger, MD   01/19/2021 6:50 PM

## 2021-01-14 LAB — ACID FAST SMEAR (AFB, MYCOBACTERIA): Acid Fast Smear: NEGATIVE

## 2021-01-16 LAB — BODY FLUID CULTURE W GRAM STAIN: Culture: NO GROWTH

## 2021-01-17 ENCOUNTER — Ambulatory Visit (HOSPITAL_BASED_OUTPATIENT_CLINIC_OR_DEPARTMENT_OTHER)
Admission: RE | Admit: 2021-01-17 | Discharge: 2021-01-17 | Disposition: A | Discharge status: Home / Self Care | Payer: 59 | Source: Ambulatory Visit | Attending: Pulmonary Disease | Admitting: Pulmonary Disease

## 2021-01-17 ENCOUNTER — Other Ambulatory Visit: Payer: Self-pay

## 2021-01-17 DIAGNOSIS — R160 Hepatomegaly, not elsewhere classified: Secondary | ICD-10-CM | POA: Diagnosis not present

## 2021-01-17 DIAGNOSIS — Z8719 Personal history of other diseases of the digestive system: Secondary | ICD-10-CM | POA: Insufficient documentation

## 2021-01-17 DIAGNOSIS — K7689 Other specified diseases of liver: Secondary | ICD-10-CM | POA: Diagnosis not present

## 2021-01-17 LAB — CYTOLOGY - NON PAP

## 2021-01-18 ENCOUNTER — Ambulatory Visit: Payer: 59

## 2021-01-19 ENCOUNTER — Inpatient Hospital Stay: Payer: 59

## 2021-01-19 ENCOUNTER — Encounter: Payer: Self-pay | Admitting: Oncology

## 2021-01-19 ENCOUNTER — Other Ambulatory Visit: Payer: Self-pay

## 2021-01-19 ENCOUNTER — Inpatient Hospital Stay: Payer: 59 | Attending: Oncology | Admitting: Oncology

## 2021-01-19 VITALS — BP 159/103 | HR 87 | Temp 97.7°F | Ht 69.0 in | Wt 144.0 lb

## 2021-01-19 DIAGNOSIS — Z923 Personal history of irradiation: Secondary | ICD-10-CM | POA: Diagnosis not present

## 2021-01-19 DIAGNOSIS — Z853 Personal history of malignant neoplasm of breast: Secondary | ICD-10-CM

## 2021-01-19 DIAGNOSIS — Z87891 Personal history of nicotine dependence: Secondary | ICD-10-CM | POA: Diagnosis not present

## 2021-01-19 DIAGNOSIS — Z8616 Personal history of COVID-19: Secondary | ICD-10-CM | POA: Insufficient documentation

## 2021-01-20 ENCOUNTER — Other Ambulatory Visit: Payer: Self-pay | Admitting: Pulmonary Disease

## 2021-01-20 ENCOUNTER — Encounter: Payer: Self-pay | Admitting: Oncology

## 2021-01-20 DIAGNOSIS — Z8719 Personal history of other diseases of the digestive system: Secondary | ICD-10-CM

## 2021-01-20 LAB — CANCER ANTIGEN 27.29: CA 27.29: 345.9 U/mL — ABNORMAL HIGH (ref 0.0–38.6)

## 2021-01-23 ENCOUNTER — Telehealth: Payer: Self-pay | Admitting: *Deleted

## 2021-01-23 ENCOUNTER — Other Ambulatory Visit: Payer: Self-pay | Admitting: *Deleted

## 2021-01-23 DIAGNOSIS — Z853 Personal history of malignant neoplasm of breast: Secondary | ICD-10-CM

## 2021-01-23 NOTE — Telephone Encounter (Signed)
Per Dr. Grayland Ormond based on results of CA 27-29 patient needs follow up this week and PET scan. Call placed to patient to discuss results, recommendations of Dr. Grayland Ormond. Patient agreeable to plan and would like to proceed with having PET scan scheduled. Patient accepts video visit with Dr. Grayland Ormond tomorrow at 2:45.

## 2021-01-24 ENCOUNTER — Inpatient Hospital Stay (HOSPITAL_BASED_OUTPATIENT_CLINIC_OR_DEPARTMENT_OTHER): Payer: 59 | Admitting: Oncology

## 2021-01-24 ENCOUNTER — Encounter: Payer: Self-pay | Admitting: Oncology

## 2021-01-24 DIAGNOSIS — Z853 Personal history of malignant neoplasm of breast: Secondary | ICD-10-CM | POA: Diagnosis not present

## 2021-01-24 LAB — CHOLESTEROL, BODY FLUID: Cholesterol, Fluid: 102 mg/dL

## 2021-01-24 NOTE — Progress Notes (Signed)
[place]  [phone] Fax:([phone]) [phone]  ID: Jamie Wu OB: 12/12/1970  MR#: [id number]  DJM#:[id number]  Patient Care Team: Rusty Aus, [name], [name], [name], [name], MD as Consulting Physician (Family Medicine)  I connected with Jamie Wu on 01/24/21 at  2:45 PM EDT by video enabled telemedicine visit and verified that I am speaking with the correct person using two identifiers.   I discussed the limitations, risks, security and privacy concerns of performing an evaluation and management service by telemedicine and the availability of in-person appointments. I also discussed with the patient that there may be a patient responsible charge related to this service. The patient expressed understanding and agreed to proceed.   Other persons participating in the visit and their role in the encounter: Patient, MD.  Patient's location: Home. Provider's location: Clinic.  CHIEF COMPLAINT: History of breast cancer.  INTERVAL HISTORY: Patient agreed to video assisted telemedicine visit to discuss her laboratory results and additional diagnostic planning.  She continues to feel well and remains asymptomatic. Her shortness of breath resolved after the thoracentesis.  She denies any pain.  She has no neurologic complaints.  She denies any recent fevers or illnesses.  She has a good appetite and denies weight loss.  She has no chest pain, shortness of breath, cough, or hemoptysis.  She denies any nausea, vomiting, constipation, or diarrhea.  She has no urinary complaints.  Patient offers no specific complaints today.  REVIEW OF SYSTEMS:   Review of Systems  Constitutional: Negative.  Negative for fever, malaise/fatigue and weight loss.  Respiratory: Negative.  Negative for cough, hemoptysis and shortness of breath.   Cardiovascular: Negative.   Negative for chest pain and leg swelling.  Gastrointestinal: Negative.  Negative for abdominal pain.  Genitourinary: Negative.  Negative for dysuria.  Musculoskeletal: Negative.  Negative for back pain.  Skin: Negative.  Negative for rash.  Neurological: Negative.  Negative for dizziness, focal weakness, weakness and headaches.  Psychiatric/Behavioral: Negative.  The patient is not nervous/anxious.     As per HPI. Otherwise, a complete review of systems is negative.  PAST MEDICAL HISTORY: Past Medical History:  Diagnosis Date  . Breast cancer [facility]) 2008   left breast  . Personal history of malignant neoplasm of breast 2008   L LUMPECTOMY  . Personal history of radiation therapy 2008   LEFT lumpectomy 2008    PAST SURGICAL HISTORY: Past Surgical History:  Procedure Laterality Date  . BREAST BIOPSY Right June 25, 2012   MRI abnormality showing fibrocystic changes with microcalcifications.  Marland Kitchen BREAST EXCISIONAL BIOPSY Left 2008   breast ca  . BREAST LUMPECTOMY Left 2008  . CHOLECYSTECTOMY  1998  . COLONOSCOPY WITH PROPOFOL N/A 07/29/2020   Procedure: COLONOSCOPY WITH PROPOFOL;  [name], MD;  [name];  Service: Endoscopy;  [name];    FAMILY HISTORY: Family History  Problem Relation Age of Onset  . Colon cancer Paternal Uncle        lat3 50's   . Breast cancer Neg Hx     ADVANCED DIRECTIVES (Y/N):  N  HEALTH MAINTENANCE: Social History   Tobacco Use  . Smoking status: Former Smoker    Packs/day: 1.00    Years: 3.00    Pack years: 3.00    Quit date: 09/10/1993    Years since quitting: 27.3  . Smokeless tobacco: Never  Used  Substance Use Topics  . Alcohol use: Yes    Alcohol/week: 0.0 standard drinks    Comment: occas-wine  . Drug use: No     Colonoscopy:  PAP:  Bone density:  Lipid panel:  Allergies  Allergen Reactions  . Contrast Media [Iodinated Diagnostic Agents] Hives    Pt get hives, itching and  sneezing.   . Sulfa Antibiotics Hives    No current outpatient medications on file.   No current facility-administered medications for this visit.    OBJECTIVE: There were no vitals filed for this visit.   There is no height or weight on file to calculate BMI.    ECOG FS:0 - Asymptomatic  General: Well-developed, well-nourished, no acute distress. HEENT: Normocephalic. Neuro: Alert, answering all questions appropriately. Cranial nerves grossly intact. Psych: Normal affect.  LAB RESULTS:  Lab Results  Component Value Date   NA 137 05/02/2017   K 3.6 05/02/2017   CL 105 05/02/2017   CO2 27 05/02/2017   GLUCOSE 88 05/02/2017   BUN 11 05/02/2017   CREATININE 0.65 05/02/2017   CALCIUM 9.1 05/02/2017   GFRNONAA >60 05/02/2017   GFRAA >60 05/02/2017    Lab Results  Component Value Date   WBC 6.0 05/02/2017   HGB 13.8 05/02/2017   HCT 40.2 05/02/2017   MCV 84.8 05/02/2017   PLT 210 05/02/2017     STUDIES: CT CHEST W CONTRAST  Addendum Date: 01/10/2021   ADDENDUM REPORT: 01/10/2021 18:11 ADDENDUM: In addition to the findings outlined in the initial report interlobular septal thickening raises the question of lymphangitic tumor particularly in the RIGHT lower lobe and in the RIGHT middle lobe, for instance on image 96 of series 8. These results will be called to the ordering clinician or representative by the Radiologist Assistant, and communication documented in the PACS or [employer]. Electronically Signed   By: Zetta Bills M.D.   On: 01/10/2021 18:11   Result Date: 01/10/2021 CLINICAL DATA:  Pneumonia due to COVID-19 infection in January. LEFT-sided chest pain and pain between ribs and iliac crest for 3 weeks, also with history of LEFT-sided breast cancer. 50 year old female EXAM: CT CHEST WITH CONTRAST TECHNIQUE: Multidetector CT imaging of the chest was performed during intravenous contrast administration. CONTRAST:  16mL ISOVUE-300 IOPAMIDOL (ISOVUE-300) INJECTION  61% COMPARISON:  September 09, 2007.  Abdominal imaging from 2018. FINDINGS: Cardiovascular: Normal caliber of the thoracic aorta. Heart size is normal. No pericardial effusion. Central pulmonary vasculature is unremarkable. Mediastinum/Nodes: Mildly patulous esophagus. Thoracic inlet structures are normal. Post LEFT-sided axillary dissection. No adenopathy in the chest. Mild infiltration of the fat in the LEFT superior mediastinum. Lungs/Pleura: Patchy areas of ground-glass and septal thickening with bandlike consolidative process over the LEFT heart border. Large dependent LEFT-sided pleural effusion. Small RIGHT-sided pleural effusion. Ground-glass and septal thickening most pronounced at the LEFT lung base. No frank bronchiectatic changes. Upper Abdomen: Suspected hepatic steatosis. Liver is incompletely imaged. Area of low attenuation in the posterior RIGHT hepatic lobe not present in 2008 but not significantly changed compared to the 2018. Post cholecystectomy. No acute upper abdominal process. Musculoskeletal: No acute musculoskeletal finding. New sclerotic focus at the T1 level (image 15/2) 9 mm New sclerotic focus go to end of sentence (image 84/4) measuring 12 mm in the T12 level. New sclerotic focus in the T8 level (image 103/4 8 mm. Other scattered subtle small foci in the spine IMPRESSION: 1. Patchy areas of ground-glass and septal thickening may represent sequela of previous COVID  infection. Ongoing or recurrent infection is not excluded. No significant signs of post inflammatory fibrosis. 2. Large LEFT and small RIGHT-sided pleural effusion. Significance uncertain in this patient with history of breast cancer. No overt nodularity in the pleural space. Would also correlate with any heart failure or volume overload. 3. Mild mediastinal fat stranding may reflect post treatment changes or could be related to ongoing inflammation in the chest. 4. Bandlike area of consolidative changes along the pleural  surface in the LEFT upper lobe may be post infectious or inflammatory or related to prior radiation. Attention on follow-up. 5. New sclerotic foci in the spine, suspicious for metastatic disease given history of breast cancer, consider follow-up PET or bone scan as warranted. 6. Post LEFT-sided axillary dissection. 7. Aortic atherosclerosis. Electronically Signed: By: Zetta Bills M.D. On: 01/10/2021 16:09   DG Chest Port 1 View  Result Date: 01/13/2021 CLINICAL DATA:  50 year old female status post left-sided thoracentesis EXAM: PORTABLE CHEST 1 VIEW COMPARISON:  CT chest 01/10/2021, plain film 09/07/2020 FINDINGS: Cardiomediastinal silhouette within normal limits. No interlobular septal thickening. Mild reticulonodular opacities of the bilateral lungs, pattern which was seen on the prior CT chest. No pneumothorax. Blunting of the bilateral costophrenic angles on the frontal view with meniscus on the left. Surgical changes of the left chest wall. No acute displaced fracture. No confluent airspace disease IMPRESSION: No complicating features status post left-sided thoracentesis, with trace residual fluid on the left and the right. Reticulonodular opacities of the lungs, compatible with findings on recent chest CT, suggesting sequela atypical infection. Electronically Signed   By: Corrie Mckusick D.O.   On: 01/13/2021 10:58   US ABDOMEN LIMITED RUQ (LIVER/GB)  Result Date: 01/17/2021 CLINICAL DATA:  50 year old female with hepatomegaly. EXAM: ULTRASOUND ABDOMEN LIMITED RIGHT UPPER QUADRANT COMPARISON:  Chest CT dated 01/10/2021 and CT abdomen pelvis dated 05/02/2017. FINDINGS: Gallbladder: Cholecystectomy. Common bile duct: Diameter: 5 mm Liver: The liver demonstrates a normal echogenicity. There is a septated cyst or 2 adjacent cyst with combined dimensions of 1.7 x 1.5 x 2.8 cm in the right lobe of the liver. Portal vein is patent on color Doppler imaging with normal direction of blood flow towards the  liver. Other: Trace right pleural effusion. IMPRESSION: 1. Cholecystectomy. 2. Right hepatic cyst. Electronically Signed   By: Anner Crete M.D.   On: 01/17/2021 23:21   US THORACENTESIS ASP PLEURAL SPACE W/IMG GUIDE  Result Date: 01/13/2021 INDICATION: 50 year old female with a history left-sided pleural effusion EXAM: ULTRASOUND GUIDED LEFT THORACENTESIS MEDICATIONS: None. COMPLICATIONS: None PROCEDURE: An ultrasound guided thoracentesis was thoroughly discussed with the patient and questions answered. The benefits, risks, alternatives and complications were also discussed. The patient understands and wishes to proceed with the procedure. Written consent was obtained. Ultrasound was performed to localize and mark an adequate pocket of fluid in the left chest. The area was then prepped and draped in the normal sterile fashion. 1% Lidocaine was used for local anesthesia. Under ultrasound guidance a 8 Fr Safe-T-Centesis catheter was introduced. Thoracentesis was performed. The catheter was removed and a dressing applied. FINDINGS: A total of approximately 1200 cc of thin amber fluid was removed. Samples were sent to the laboratory as requested by the clinical team. IMPRESSION: Status post image guided left-sided thoracentesis. Signed, Dulcy Fanny. Dellia Nims, RPVI [facility] Electronically Signed   By: Corrie Mckusick D.O.   On: 01/13/2021 11:20    ASSESSMENT: History of breast cancer, pleural effusion.  PLAN:  1. History of breast cancer: Patient underwent lumpectomy in 2008 followed by [name].  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 Patient also underwent genetic testing and was reported to be BRCA negative.  Patient's CA 27-29 is significantly elevated at 345.9.  Given this value and her imaging, there is concern for recurrence and will get a PET scan for further evaluation.  If PET scan is positive,  patient will require biopsy to confirm diagnosis.  Patient will have video assisted telemedicine visit 1 day after her PET scan to discuss the results.  2.  Genetic: By report patient is BRCA negative.  A referral has been sent to genetics to see if additional genetic testing is warranted 14 years later.  I provided 30 minutes of face-to-face video visit time during this encounter which included chart review, counseling, and coordination of care as documented above.   Patient expressed understanding and was in agreement with this plan. She also understands that She can call clinic at any time with any questions, concerns, or complaints.   Cancer Staging No matching staging information was found for the patient.  Lloyd Huger, MD   01/24/2021 4:58 PM

## 2021-01-24 NOTE — Progress Notes (Signed)
Patient scheduled for virtual visit today for follow up regarding lab results.

## 2021-01-26 ENCOUNTER — Other Ambulatory Visit: Payer: 59

## 2021-01-26 DIAGNOSIS — R0602 Shortness of breath: Secondary | ICD-10-CM | POA: Diagnosis not present

## 2021-01-26 DIAGNOSIS — J90 Pleural effusion, not elsewhere classified: Secondary | ICD-10-CM | POA: Diagnosis not present

## 2021-01-26 DIAGNOSIS — Z8709 Personal history of other diseases of the respiratory system: Secondary | ICD-10-CM | POA: Diagnosis not present

## 2021-01-26 NOTE — Progress Notes (Signed)
[place]  Telephone:(336) [phone] Fax:(336) [phone]  ID: Jamie Wu OB: 01/14/1971  MR#: [id number]  GYB#:[id number]  Patient Care Team: Rusty Aus, [name], [name], [name], [name], MD as Consulting Physician (Family Medicine)  I connected with Jamie Wu on 02/02/21 at 11:15 AM EDT by video enabled telemedicine visit and verified that I am speaking with the correct person using two identifiers.   I discussed the limitations, risks, security and privacy concerns of performing an evaluation and management service by telemedicine and the availability of in-person appointments. I also discussed with the patient that there may be a patient responsible charge related to this service. The patient expressed understanding and agreed to proceed.   Other persons participating in the visit and their role in the encounter: Patient, patient's husband, MD.  Patient's location: Car. Provider's location: Clinic.  CHIEF COMPLAINT: History of breast cancer with likely recurrence.  INTERVAL HISTORY: Patient agreed to video assisted telemedicine visit to discuss her PET scan results and additional diagnostic planning.  She continues to feel well and remains asymptomatic.  She continues to have mild shortness of breath, but this has significantly improved after her thoracentesis.  She denies any pain.  She has no neurologic complaints.  She denies any recent fevers or illnesses.  She has a good appetite and denies weight loss.  She has no chest pain, shortness of breath, cough, or hemoptysis.  She denies any nausea, vomiting, constipation, or diarrhea.  She has no urinary complaints.  Patient offers no further specific complaints today.  REVIEW OF SYSTEMS:   Review of Systems  Constitutional: Negative.  Negative for fever, malaise/fatigue and weight loss.   Respiratory: Positive for shortness of breath. Negative for cough and hemoptysis.   Cardiovascular: Negative.  Negative for chest pain and leg swelling.  Gastrointestinal: Negative.  Negative for abdominal pain.  Genitourinary: Negative.  Negative for dysuria.  Musculoskeletal: Negative.  Negative for back pain.  Skin: Negative.  Negative for rash.  Neurological: Negative.  Negative for dizziness, focal weakness, weakness and headaches.  Psychiatric/Behavioral: Negative.  The patient is not nervous/anxious.     As per HPI. Otherwise, a complete review of systems is negative.  PAST MEDICAL HISTORY: Past Medical History:  Diagnosis Date  . Breast cancer [facility]) 2008   left breast  . Personal history of malignant neoplasm of breast 2008   L LUMPECTOMY  . Personal history of radiation therapy 2008   LEFT lumpectomy 2008    PAST SURGICAL HISTORY: Past Surgical History:  Procedure Laterality Date  . BREAST BIOPSY Right June 25, 2012   MRI abnormality showing fibrocystic changes with microcalcifications.  Marland Kitchen BREAST EXCISIONAL BIOPSY Left 2008   breast ca  . BREAST LUMPECTOMY Left 2008  . CHOLECYSTECTOMY  1998  . COLONOSCOPY WITH PROPOFOL N/A 07/29/2020   Procedure: COLONOSCOPY WITH PROPOFOL;  [name], MD;  [name];  Service: Endoscopy;  [name];    FAMILY HISTORY: Family History  Problem Relation Age of Onset  . Colon cancer Paternal Uncle        lat3 50's   . Breast cancer Neg Hx     ADVANCED DIRECTIVES (Y/N):  N  HEALTH MAINTENANCE: Social History   Tobacco Use  . Smoking status: Former Smoker    Packs/day: 1.00    Years: 3.00    Pack years: 3.00  Quit date: 09/10/1993    Years since quitting: 27.4  . Smokeless tobacco: Never Used  Substance Use Topics  . Alcohol use: Yes    Alcohol/week: 0.0 standard drinks    Comment: occas-wine  . Drug use: No     Colonoscopy:  PAP:  Bone density:  Lipid  panel:  Allergies  Allergen Reactions  . Contrast Media [Iodinated Diagnostic Agents] Hives    Pt get hives, itching and sneezing.   . Sulfa Antibiotics Hives    No current outpatient medications on file.   No current facility-administered medications for this visit.    OBJECTIVE: There were no vitals filed for this visit.   There is no height or weight on file to calculate BMI.    ECOG FS:0 - Asymptomatic  General: Well-developed, well-nourished, no acute distress. HEENT: Normocephalic. Neuro: Alert, answering all questions appropriately. Cranial nerves grossly intact. Psych: Normal affect.   LAB RESULTS:  Lab Results  Component Value Date   NA 137 05/02/2017   K 3.6 05/02/2017   CL 105 05/02/2017   CO2 27 05/02/2017   GLUCOSE 88 05/02/2017   BUN 11 05/02/2017   CREATININE 0.65 05/02/2017   CALCIUM 9.1 05/02/2017   GFRNONAA >60 05/02/2017   GFRAA >60 05/02/2017    Lab Results  Component Value Date   WBC 6.0 05/02/2017   HGB 13.8 05/02/2017   HCT 40.2 05/02/2017   MCV 84.8 05/02/2017   PLT 210 05/02/2017     STUDIES: CT CHEST W CONTRAST  Addendum Date: 01/10/2021   ADDENDUM REPORT: 01/10/2021 18:11 ADDENDUM: In addition to the findings outlined in the initial report interlobular septal thickening raises the question of lymphangitic tumor particularly in the RIGHT lower lobe and in the RIGHT middle lobe, for instance on image 96 of series 8. These results will be called to the ordering clinician or representative by the Radiologist Assistant, and communication documented in the PACS or [employer]. Electronically Signed   By: Zetta Bills M.D.   On: 01/10/2021 18:11   Result Date: 01/10/2021 CLINICAL DATA:  Pneumonia due to COVID-19 infection in January. LEFT-sided chest pain and pain between ribs and iliac crest for 3 weeks, also with history of LEFT-sided breast cancer. 50 year old female EXAM: CT CHEST WITH CONTRAST TECHNIQUE: Multidetector CT imaging of  the chest was performed during intravenous contrast administration. CONTRAST:  78mL ISOVUE-300 IOPAMIDOL (ISOVUE-300) INJECTION 61% COMPARISON:  September 09, 2007.  Abdominal imaging from 2018. FINDINGS: Cardiovascular: Normal caliber of the thoracic aorta. Heart size is normal. No pericardial effusion. Central pulmonary vasculature is unremarkable. Mediastinum/Nodes: Mildly patulous esophagus. Thoracic inlet structures are normal. Post LEFT-sided axillary dissection. No adenopathy in the chest. Mild infiltration of the fat in the LEFT superior mediastinum. Lungs/Pleura: Patchy areas of ground-glass and septal thickening with bandlike consolidative process over the LEFT heart border. Large dependent LEFT-sided pleural effusion. Small RIGHT-sided pleural effusion. Ground-glass and septal thickening most pronounced at the LEFT lung base. No frank bronchiectatic changes. Upper Abdomen: Suspected hepatic steatosis. Liver is incompletely imaged. Area of low attenuation in the posterior RIGHT hepatic lobe not present in 2008 but not significantly changed compared to the 2018. Post cholecystectomy. No acute upper abdominal process. Musculoskeletal: No acute musculoskeletal finding. New sclerotic focus at the T1 level (image 15/2) 9 mm New sclerotic focus go to end of sentence (image 84/4) measuring 12 mm in the T12 level. New sclerotic focus in the T8 level (image 103/4 8 mm. Other scattered subtle small foci in the  spine IMPRESSION: 1. Patchy areas of ground-glass and septal thickening may represent sequela of previous COVID infection. Ongoing or recurrent infection is not excluded. No significant signs of post inflammatory fibrosis. 2. Large LEFT and small RIGHT-sided pleural effusion. Significance uncertain in this patient with history of breast cancer. No overt nodularity in the pleural space. Would also correlate with any heart failure or volume overload. 3. Mild mediastinal fat stranding may reflect post treatment  changes or could be related to ongoing inflammation in the chest. 4. Bandlike area of consolidative changes along the pleural surface in the LEFT upper lobe may be post infectious or inflammatory or related to prior radiation. Attention on follow-up. 5. New sclerotic foci in the spine, suspicious for metastatic disease given history of breast cancer, consider follow-up PET or bone scan as warranted. 6. Post LEFT-sided axillary dissection. 7. Aortic atherosclerosis. Electronically Signed: By: Zetta Bills M.D. On: 01/10/2021 16:09   NM PET Image Initial (PI) Skull Base To Thigh  Result Date: 02/01/2021 CLINICAL DATA:  Initial treatment strategy for breast cancer in 2008 status post conservation therapy, with significantly elevated CA 27-29 and new sclerotic bone lesions and pleural effusions on chest imaging, concerning for recurrent malignancy. EXAM: NUCLEAR MEDICINE PET SKULL BASE TO THIGH TECHNIQUE: 7.8 mCi F-18 FDG was injected intravenously. Full-ring PET imaging was performed from the skull base to thigh after the radiotracer. CT data was obtained and used for attenuation correction and anatomic localization. Fasting blood glucose: 71 mg/dl COMPARISON:  01/10/2021 chest CT.  05/02/2017 CT abdomen/pelvis. FINDINGS: Mediastinal blood pool activity: SUV max 2.4 Liver activity: SUV max NA NECK: No hypermetabolic lymph nodes in the neck. Incidental CT findings: none CHEST: Bilateral dependent pleural effusions, moderate on the left and small on the right, similar. No discrete pleural nodularity or hypermetabolism. Persistent patchy ground-glass opacities and interlobular septal thickening throughout both lungs, with interval worsening of the ground-glass opacities particularly in the right upper and right lower lobes. Low level FDG uptake associated with the regions of patchy ground-glass opacity, for example max SUV 2.2 in right lower lobe. No lung masses or significant discrete pulmonary nodules. No enlarged  or hypermetabolic axillary, mediastinal or hilar lymph nodes. Incidental CT findings: Small pericardial effusion is stable. ABDOMEN/PELVIS: No abnormal hypermetabolic activity within the liver, pancreas, adrenal glands, or spleen. No hypermetabolic lymph nodes in the abdomen or pelvis. Incidental CT findings: Simple 2.2 cm posterior right liver cyst. Cholecystectomy. Mildly atherosclerotic nonaneurysmal abdominal aorta. Moderate diffuse colonic diverticulosis. SKELETON: Several scattered small sclerotic lesions throughout the thoracic and lumbar vertebral bodies, sacrum and iliac crests, a few of which demonstrate low level hypermetabolism. Representative faintly sclerotic 1.1 cm T7 vertebral lesion with max SUV 3.1 (series 610/image 82). Representative high left sacral 1.2 cm sclerotic lesion with max SUV 2.2 (series 3/image 197). Incidental CT findings: none IMPRESSION: 1. Low-level hypermetabolism associated with a few scattered small sclerotic lesions in the axial skeleton, most prominent in the T7 vertebral body (max SUV 3.1), suspicious for metastatic disease. 2. Persistent patchy ground-glass opacities and interlobular septal thickening throughout both lungs, with interval worsening of the ground-glass opacities in the right lung. Low level FDG uptake associated with the ground-glass opacities. Findings remain suspicious for lymphangitic tumor. 3. Moderate left and small right pleural effusions, unchanged. No discrete pleural hypermetabolism. 4. No hypermetabolic lymphadenopathy or other potential sites of hypermetabolic metastatic disease. 5. Stable small pericardial effusion. Electronically Signed   By: Ilona Sorrel M.D.   On: 02/01/2021 11:41  DG Chest Port 1 View  Result Date: 01/13/2021 CLINICAL DATA:  50 year old female status post left-sided thoracentesis EXAM: PORTABLE CHEST 1 VIEW COMPARISON:  CT chest 01/10/2021, plain film 09/07/2020 FINDINGS: Cardiomediastinal silhouette within normal limits.  No interlobular septal thickening. Mild reticulonodular opacities of the bilateral lungs, pattern which was seen on the prior CT chest. No pneumothorax. Blunting of the bilateral costophrenic angles on the frontal view with meniscus on the left. Surgical changes of the left chest wall. No acute displaced fracture. No confluent airspace disease IMPRESSION: No complicating features status post left-sided thoracentesis, with trace residual fluid on the left and the right. Reticulonodular opacities of the lungs, compatible with findings on recent chest CT, suggesting sequela atypical infection. Electronically Signed   By: Corrie Mckusick D.O.   On: 01/13/2021 10:58   US ABDOMEN LIMITED RUQ (LIVER/GB)  Result Date: 01/17/2021 CLINICAL DATA:  50 year old female with hepatomegaly. EXAM: ULTRASOUND ABDOMEN LIMITED RIGHT UPPER QUADRANT COMPARISON:  Chest CT dated 01/10/2021 and CT abdomen pelvis dated 05/02/2017. FINDINGS: Gallbladder: Cholecystectomy. Common bile duct: Diameter: 5 mm Liver: The liver demonstrates a normal echogenicity. There is a septated cyst or 2 adjacent cyst with combined dimensions of 1.7 x 1.5 x 2.8 cm in the right lobe of the liver. Portal vein is patent on color Doppler imaging with normal direction of blood flow towards the liver. Other: Trace right pleural effusion. IMPRESSION: 1. Cholecystectomy. 2. Right hepatic cyst. Electronically Signed   By: Anner Crete M.D.   On: 01/17/2021 23:21   US THORACENTESIS ASP PLEURAL SPACE W/IMG GUIDE  Result Date: 01/13/2021 INDICATION: 50 year old female with a history left-sided pleural effusion EXAM: ULTRASOUND GUIDED LEFT THORACENTESIS MEDICATIONS: None. COMPLICATIONS: None PROCEDURE: An ultrasound guided thoracentesis was thoroughly discussed with the patient and questions answered. The benefits, risks, alternatives and complications were also discussed. The patient understands and wishes to proceed with the procedure. Written consent was obtained.  Ultrasound was performed to localize and mark an adequate pocket of fluid in the left chest. The area was then prepped and draped in the normal sterile fashion. 1% Lidocaine was used for local anesthesia. Under ultrasound guidance a 8 Fr Safe-T-Centesis catheter was introduced. Thoracentesis was performed. The catheter was removed and a dressing applied. FINDINGS: A total of approximately 1200 cc of thin amber fluid was removed. Samples were sent to the laboratory as requested by the clinical team. IMPRESSION: Status post image guided left-sided thoracentesis. Signed, Dulcy Fanny. Dellia Nims, RPVI [facility] Electronically Signed   By: Corrie Mckusick D.O.   On: 01/13/2021 11:20    ASSESSMENT: History of breast cancer with likely recurrence.  PLAN:    1. History of breast cancer with likely recurrence: Patient underwent lumpectomy in 2008 followed by [name].  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 Patient also underwent genetic testing and was reported to be BRCA negative.  Patient's CA 27-29 is significantly elevated at 345.9.  PET scan is mildly positive with several new bony lesions with minimal SUV.  Case discussed with interventional radiology and biopsy to confirm diagnosis would be difficult.  We also discussed the possibility of repeating thoracentesis to obtain diagnosis.  After lengthy discussion with the patient, she acknowledges that this is likely recurrence, but with no easy place to biopsy and not a significant tumor burden, she is considering active surveillance.  She does not wish to have a second thoracentesis.  We discussed the possibility of  treating with single agent letrozole.  Patient will contact clinic when she makes her final decision.  If she pursues active surveillance, will repeat tumor marker and PET scan in 3 months. 2.  Genetic: By report patient is BRCA negative.  A referral  has been sent to genetics to see if additional genetic testing is warranted 14 years later.   I provided 30 minutes of face-to-face video visit time during this encounter which included chart review, counseling, and coordination of care as documented above.  Patient expressed understanding and was in agreement with this plan. She also understands that She can call clinic at any time with any questions, concerns, or complaints.   Cancer Staging No matching staging information was found for the patient.  Lloyd Huger, MD   02/02/2021 9:00 AM

## 2021-01-26 NOTE — Progress Notes (Signed)
Tumor Board Documentation  Jamie Wu was presented by Dr Grayland Ormond at our Tumor Board on 01/26/2021, which included representatives from medical oncology,radiation oncology,surgical oncology,internal medicine,navigation,pathology,radiology,pharmacy,genetics,research,palliative care,pulmonology.  Jamie Wu currently presents as a current patient,for discussion,for progression with history of the following treatments: neoadjuvant radiation (4 1/2 years of Tamoxifen).  Additionally, we reviewed previous medical and familial history, history of present illness, and recent lab results along with all available histopathologic and imaging studies. The tumor board considered available treatment options and made the following recommendations: Additional screening,Biopsy (PET Scan) Refer to Genetics  The following procedures/referrals were also placed: No orders of the defined types were placed in this encounter.   Clinical Trial Status: not discussed   Staging used: AJCC Stage Group  AJCC Staging: T: 1 N: 0 M: x Group: Stage ! Breast Cancer with possible recuurent disease (spinal lesions)   National site-specific guidelines NCCN were discussed with respect to the case.  Tumor board is a meeting of clinicians from various specialty areas who evaluate and discuss patients for whom a multidisciplinary approach is being considered. Final determinations in the plan of care are those of the provider(s). The responsibility for follow up of recommendations given during tumor board is that of the provider.   Today's extended care, comprehensive team conference, Jamie Wu was not present for the discussion and was not examined.   Multidisciplinary Tumor Board is a multidisciplinary case peer review process.  Decisions discussed in the Multidisciplinary Tumor Board reflect the opinions of the specialists present at the conference without having examined the patient.  Ultimately, treatment and diagnostic  decisions rest with the primary provider(s) and the patient.

## 2021-01-30 ENCOUNTER — Telehealth: Payer: Self-pay | Admitting: *Deleted

## 2021-01-30 NOTE — Telephone Encounter (Signed)
She is contrast dye allergy, should only be necessary prior to CT scans.

## 2021-01-30 NOTE — Telephone Encounter (Signed)
Patient informed that she should not need prep as side effects is having a PET scan

## 2021-01-30 NOTE — Telephone Encounter (Signed)
Patient called stating that she needs pre medications for a procedure she is scheduled for. She is scheduled for a PET Scan tomorrow

## 2021-01-31 ENCOUNTER — Ambulatory Visit
Admission: RE | Admit: 2021-01-31 | Discharge: 2021-01-31 | Disposition: A | Discharge status: Home / Self Care | Payer: 59 | Source: Ambulatory Visit | Attending: Oncology | Admitting: Oncology

## 2021-01-31 ENCOUNTER — Other Ambulatory Visit: Payer: Self-pay

## 2021-01-31 DIAGNOSIS — J90 Pleural effusion, not elsewhere classified: Secondary | ICD-10-CM | POA: Diagnosis not present

## 2021-01-31 DIAGNOSIS — I313 Pericardial effusion (noninflammatory): Secondary | ICD-10-CM | POA: Diagnosis not present

## 2021-01-31 DIAGNOSIS — Z853 Personal history of malignant neoplasm of breast: Secondary | ICD-10-CM | POA: Insufficient documentation

## 2021-01-31 LAB — GLUCOSE, CAPILLARY: Glucose-Capillary: 71 mg/dL (ref 70–99)

## 2021-01-31 MED ORDER — FLUDEOXYGLUCOSE F - 18 (FDG) INJECTION
7.8300 | Freq: Once | INTRAVENOUS | Status: AC | PRN
  Administered 2021-01-31: 7.83 via INTRAVENOUS

## 2021-02-01 ENCOUNTER — Other Ambulatory Visit: Payer: Self-pay

## 2021-02-01 ENCOUNTER — Inpatient Hospital Stay (HOSPITAL_BASED_OUTPATIENT_CLINIC_OR_DEPARTMENT_OTHER): Payer: 59 | Admitting: Oncology

## 2021-02-01 DIAGNOSIS — Z853 Personal history of malignant neoplasm of breast: Secondary | ICD-10-CM | POA: Diagnosis not present

## 2021-02-10 LAB — FUNGUS CULTURE WITH STAIN

## 2021-02-10 LAB — FUNGUS CULTURE RESULT

## 2021-02-10 LAB — FUNGAL ORGANISM REFLEX

## 2021-02-15 DIAGNOSIS — D2271 Melanocytic nevi of right lower limb, including hip: Secondary | ICD-10-CM | POA: Diagnosis not present

## 2021-02-15 DIAGNOSIS — L821 Other seborrheic keratosis: Secondary | ICD-10-CM | POA: Diagnosis not present

## 2021-02-15 DIAGNOSIS — D2261 Melanocytic nevi of right upper limb, including shoulder: Secondary | ICD-10-CM | POA: Diagnosis not present

## 2021-02-15 DIAGNOSIS — D225 Melanocytic nevi of trunk: Secondary | ICD-10-CM | POA: Diagnosis not present

## 2021-02-15 DIAGNOSIS — D2272 Melanocytic nevi of left lower limb, including hip: Secondary | ICD-10-CM | POA: Diagnosis not present

## 2021-02-15 DIAGNOSIS — D2262 Melanocytic nevi of left upper limb, including shoulder: Secondary | ICD-10-CM | POA: Diagnosis not present

## 2021-02-16 DIAGNOSIS — R0789 Other chest pain: Secondary | ICD-10-CM | POA: Diagnosis not present

## 2021-02-21 DIAGNOSIS — J90 Pleural effusion, not elsewhere classified: Secondary | ICD-10-CM | POA: Diagnosis not present

## 2021-02-21 DIAGNOSIS — R0600 Dyspnea, unspecified: Secondary | ICD-10-CM | POA: Diagnosis not present

## 2021-02-28 DIAGNOSIS — R0600 Dyspnea, unspecified: Secondary | ICD-10-CM | POA: Diagnosis not present

## 2021-02-28 LAB — ACID FAST CULTURE WITH REFLEXED SENSITIVITIES (MYCOBACTERIA): Acid Fast Culture: NEGATIVE

## 2021-03-06 ENCOUNTER — Encounter: Payer: Self-pay | Admitting: Oncology

## 2021-03-06 DIAGNOSIS — Z853 Personal history of malignant neoplasm of breast: Secondary | ICD-10-CM

## 2021-03-26 ENCOUNTER — Other Ambulatory Visit: Payer: Self-pay | Admitting: General Surgery

## 2021-03-26 DIAGNOSIS — J90 Pleural effusion, not elsewhere classified: Secondary | ICD-10-CM

## 2021-03-27 ENCOUNTER — Other Ambulatory Visit
Admission: RE | Admit: 2021-03-27 | Discharge: 2021-03-27 | Disposition: A | Discharge status: Home / Self Care | Payer: 59 | Source: Ambulatory Visit | Attending: Oncology | Admitting: Oncology

## 2021-03-27 ENCOUNTER — Ambulatory Visit
Admission: RE | Admit: 2021-03-27 | Discharge: 2021-03-27 | Disposition: A | Discharge status: Home / Self Care | Payer: 59 | Source: Ambulatory Visit | Attending: General Surgery | Admitting: General Surgery

## 2021-03-27 ENCOUNTER — Other Ambulatory Visit: Payer: Self-pay | Admitting: General Surgery

## 2021-03-27 ENCOUNTER — Ambulatory Visit
Admission: RE | Admit: 2021-03-27 | Discharge: 2021-03-27 | Disposition: A | Discharge status: Home / Self Care | Payer: 59 | Source: Home / Self Care | Attending: General Surgery | Admitting: General Surgery

## 2021-03-27 ENCOUNTER — Other Ambulatory Visit: Payer: Self-pay

## 2021-03-27 DIAGNOSIS — J90 Pleural effusion, not elsewhere classified: Secondary | ICD-10-CM

## 2021-03-27 DIAGNOSIS — Z9889 Other specified postprocedural states: Secondary | ICD-10-CM | POA: Insufficient documentation

## 2021-03-27 DIAGNOSIS — J9811 Atelectasis: Secondary | ICD-10-CM | POA: Diagnosis not present

## 2021-03-27 DIAGNOSIS — Z20822 Contact with and (suspected) exposure to covid-19: Secondary | ICD-10-CM | POA: Insufficient documentation

## 2021-03-27 DIAGNOSIS — C50912 Malignant neoplasm of unspecified site of left female breast: Secondary | ICD-10-CM | POA: Diagnosis not present

## 2021-03-27 LAB — SARS CORONAVIRUS 2 BY RT PCR (HOSPITAL ORDER, PERFORMED IN ~~LOC~~ HOSPITAL LAB): SARS Coronavirus 2: NEGATIVE

## 2021-03-27 NOTE — Procedures (Addendum)
Ultrasound-guided diagnostic and therapeutic left sided thoracentesis performed yielding 1.5 liters of amber colored fluid. No immediate complications.   Diagnostic fluid was sent to the lab for further analysis. Follow-up chest x-ray pending. EBL is < 2 ml. Patient unable to tolerate additional fluid removal at this time.

## 2021-03-29 LAB — CYTOLOGY - NON PAP

## 2021-03-30 ENCOUNTER — Encounter: Payer: Self-pay | Admitting: General Surgery

## 2021-03-30 ENCOUNTER — Other Ambulatory Visit: Payer: Self-pay | Admitting: General Surgery

## 2021-03-30 ENCOUNTER — Other Ambulatory Visit
Admission: RE | Admit: 2021-03-30 | Discharge: 2021-03-30 | Disposition: A | Discharge status: Home / Self Care | Payer: 59 | Attending: General Surgery | Admitting: General Surgery

## 2021-03-30 DIAGNOSIS — Z853 Personal history of malignant neoplasm of breast: Secondary | ICD-10-CM | POA: Diagnosis not present

## 2021-03-31 LAB — CANCER ANTIGEN 27.29: CA 27.29: 434 U/mL — ABNORMAL HIGH (ref 0.0–38.6)

## 2021-04-03 ENCOUNTER — Encounter: Payer: Self-pay | Admitting: Oncology

## 2021-04-03 NOTE — Progress Notes (Signed)
[place]  Telephone:(336) [phone] Fax:(336) [phone]  ID: Jamie Wu OB: Jun 10, 1971  MR#: [id number]  NUU#:[id number]  Patient Care Team: Rusty Aus, [name], [name], [name], [name], MD as Consulting Physician (Family Medicine)  I connected with Jamie Wu on 04/06/21 at  3:00 PM EDT by video enabled telemedicine visit and verified that I am speaking with the correct person using two identifiers.   I discussed the limitations, risks, security and privacy concerns of performing an evaluation and management service by telemedicine and the availability of in-person appointments. I also discussed with the patient that there may be a patient responsible charge related to this service. The patient expressed understanding and agreed to proceed.   Other persons participating in the visit and their role in the encounter: Patient, patient's husband, MD.  Patient's location: Home. Provider's location: Clinic.  CHIEF COMPLAINT: History of breast cancer with likely recurrence.  INTERVAL HISTORY: Patient agreed to video assisted telemedicine visit for further evaluation and discussion of her imaging and laboratory results.  She recently underwent a second thoracentesis that removed 1.5 L of fluid, but once again no malignant cells were identified only inflammatory mesothelial cells.  She felt improved after her thoracentesis, but admits over the past week she feels increasingly shortness of breath.  She otherwise feels well and is asymptomatic. She denies any pain.  She has no neurologic complaints.  She denies any recent fevers or illnesses.  She has a good appetite and denies weight loss.  She has no chest pain, shortness of breath, cough, or hemoptysis.  She denies any nausea, vomiting, constipation, or diarrhea.  She has no urinary complaints.  Patient offers  no further specific complaints today.    REVIEW OF SYSTEMS:   Review of Systems  Constitutional: Negative.  Negative for fever, malaise/fatigue and weight loss.  Respiratory:  Positive for shortness of breath. Negative for cough and hemoptysis.   Cardiovascular: Negative.  Negative for chest pain and leg swelling.  Gastrointestinal: Negative.  Negative for abdominal pain.  Genitourinary: Negative.  Negative for dysuria.  Musculoskeletal: Negative.  Negative for back pain.  Skin: Negative.  Negative for rash.  Neurological: Negative.  Negative for dizziness, focal weakness, weakness and headaches.  Psychiatric/Behavioral: Negative.  The patient is not nervous/anxious.    As per HPI. Otherwise, a complete review of systems is negative.  PAST MEDICAL HISTORY: Past Medical History:  Diagnosis Date   Breast cancer ([place]) 2008   left breast   Personal history of malignant neoplasm of breast 2008   L LUMPECTOMY   Personal history of radiation therapy 2008   LEFT lumpectomy 2008    PAST SURGICAL HISTORY: Past Surgical History:  Procedure Laterality Date   BREAST BIOPSY Right June 25, 2012   MRI abnormality showing fibrocystic changes with microcalcifications.   BREAST EXCISIONAL BIOPSY Left 2008   breast ca   BREAST LUMPECTOMY Left 2008   CHOLECYSTECTOMY  1998   COLONOSCOPY WITH PROPOFOL N/A 07/29/2020   Procedure: COLONOSCOPY WITH PROPOFOL;  [name], MD;  [name];  Service: Endoscopy;  [name];    FAMILY HISTORY: Family History  Problem Relation Age of Onset   Colon cancer Paternal Uncle        lat3 69's    Breast cancer Neg Hx     ADVANCED DIRECTIVES (Y/N):  N  HEALTH MAINTENANCE: Social History  Tobacco Use   Smoking status: Former    Packs/day: 1.00    Years: 3.00    Pack years: 3.00    Types: Cigarettes    Quit date: 09/10/1993    Years since quitting: 27.5   Smokeless tobacco: Never  Substance Use Topics    Alcohol use: Yes    Alcohol/week: 0.0 standard drinks    Comment: occas-wine   Drug use: No     Colonoscopy:  PAP:  Bone density:  Lipid panel:  Allergies  Allergen Reactions   Contrast Media [Iodinated Diagnostic Agents] Hives    Pt get hives, itching and sneezing.    Sulfa Antibiotics Hives    No current outpatient medications on file.   No current facility-administered medications for this visit.    OBJECTIVE: There were no vitals filed for this visit.   There is no height or weight on file to calculate BMI.    ECOG FS:0 - Asymptomatic  General: Well-developed, well-nourished, no acute distress. HEENT: Normocephalic. Neuro: Alert, answering all questions appropriately. Cranial nerves grossly intact. Psych: Normal affect.   LAB RESULTS:  Lab Results  Component Value Date   NA 137 05/02/2017   K 3.6 05/02/2017   CL 105 05/02/2017   CO2 27 05/02/2017   GLUCOSE 88 05/02/2017   BUN 11 05/02/2017   CREATININE 0.65 05/02/2017   CALCIUM 9.1 05/02/2017   GFRNONAA >60 05/02/2017   GFRAA >60 05/02/2017    Lab Results  Component Value Date   WBC 6.0 05/02/2017   HGB 13.8 05/02/2017   HCT 40.2 05/02/2017   MCV 84.8 05/02/2017   PLT 210 05/02/2017     STUDIES: DG Chest Port 1 View  Result Date: 03/27/2021 CLINICAL DATA:  Left effusion status post thoracentesis EXAM: PORTABLE CHEST 1 VIEW COMPARISON:  01/13/2021 FINDINGS: Trace pleural effusions bilaterally. Minor basilar atelectasis. Normal heart size and vascularity. Left axillary clips noted. Apical scarring present. Negative for pneumothorax. IMPRESSION: Trace residual bilateral pleural effusions. Negative for pneumothorax following left thoracentesis. Electronically Signed   By: Jerilynn Mages.  Shick M.D.   On: 03/27/2021 12:41   US THORACENTESIS ASP PLEURAL SPACE W/IMG GUIDE  Result Date: 03/27/2021 INDICATION: Patient with history of recurrent left-sided breast cancer. Found to have a left-sided pleural effusion.  Request is for therapeutic and diagnostic thoracentesis EXAM: ULTRASOUND GUIDED THERAPEUTIC AND DIAGNOSTIC LEFT-SIDED THORACENTESIS MEDICATIONS: Lidocaine 1% 10 mL COMPLICATIONS: None immediate. PROCEDURE: An ultrasound guided thoracentesis was thoroughly discussed with the patient and questions answered. The benefits, risks, alternatives and complications were also discussed. The patient understands and wishes to proceed with the procedure. Written consent was obtained. Ultrasound was performed to localize and mark an adequate pocket of fluid in the left chest. The area was then prepped and draped in the normal sterile fashion. 1% Lidocaine was used for local anesthesia. Under ultrasound guidance a 6 Fr Safe-T-Centesis catheter was introduced. Thoracentesis was performed. The catheter was removed and a dressing applied. Patient unable to tolerate additional fluid removal at this time FINDINGS: A total of approximately 1.5 L of amber colored fluid was removed. Samples were sent to the laboratory as requested by the clinical team. IMPRESSION: Successful ultrasound guided therapeutic and diagnostic left-sided thoracentesis yielding 1.5 L of pleural fluid. Read by: Rushie Nyhan, NP Electronically Signed   By: Jerilynn Mages.  Shick M.D.   On: 03/27/2021 12:21     ASSESSMENT: History of breast cancer with likely recurrence.  PLAN:    1. History of breast cancer with likely recurrence:  Patient underwent lumpectomy in 2008 followed by [name].  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 Patient also underwent genetic testing and was reported to be BRCA negative.  Patient's CA 27-29 continues to increase from 345.9-434.0.  Previously, PET scan on Jan 31, 2021 reviewed independently with several new bony lesions with minimal SUV.  Case discussed with interventional radiology and biopsy to confirm diagnosis would be difficult.  Repeat thoracentesis on March 27, 2021 removed 1.5 L  fluid, but once again no malignant cells were identified.  Patient has an appointment next week at [facility] with Dr. Kerin Ransom to discuss possible VATS for pleurodesis and to obtain pleural biopsies.  Patient currently has a PET scan scheduled for May 08, 2021 with follow-up 1 to 2 days later.  We will keep this as scheduled pending results of procedure at [facility]  2.  Genetics: By report patient is BRCA negative.  Previously, A referral has been sent to genetics to see if additional genetic testing is warranted 14 years later.  I provided 30 minutes of face-to-face video visit time during this encounter which included chart review, counseling, and coordination of care as documented above.   Patient expressed understanding and was in agreement with this plan. She also understands that She can call clinic at any time with any questions, concerns, or complaints.   Cancer Staging No matching staging information was found for the patient.  Lloyd Huger, MD   04/06/2021 3:54 PM

## 2021-04-06 ENCOUNTER — Encounter: Payer: Self-pay | Admitting: Oncology

## 2021-04-06 ENCOUNTER — Inpatient Hospital Stay: Payer: 59 | Attending: Oncology | Admitting: Oncology

## 2021-04-06 DIAGNOSIS — Z853 Personal history of malignant neoplasm of breast: Secondary | ICD-10-CM | POA: Diagnosis not present

## 2021-04-06 NOTE — Progress Notes (Signed)
Patient states she is having some diarrhea and has for the past two weeks. Patient states she wants to discuss her shortness of breathe also during visit.

## 2021-04-11 DIAGNOSIS — J9811 Atelectasis: Secondary | ICD-10-CM | POA: Diagnosis not present

## 2021-04-11 DIAGNOSIS — R071 Chest pain on breathing: Secondary | ICD-10-CM | POA: Diagnosis not present

## 2021-04-11 DIAGNOSIS — Z853 Personal history of malignant neoplasm of breast: Secondary | ICD-10-CM | POA: Diagnosis not present

## 2021-04-11 DIAGNOSIS — J90 Pleural effusion, not elsewhere classified: Secondary | ICD-10-CM | POA: Diagnosis not present

## 2021-04-14 DIAGNOSIS — R0602 Shortness of breath: Secondary | ICD-10-CM | POA: Diagnosis not present

## 2021-04-14 DIAGNOSIS — Z20822 Contact with and (suspected) exposure to covid-19: Secondary | ICD-10-CM | POA: Diagnosis not present

## 2021-04-14 DIAGNOSIS — Z853 Personal history of malignant neoplasm of breast: Secondary | ICD-10-CM | POA: Diagnosis not present

## 2021-04-14 DIAGNOSIS — Z01818 Encounter for other preprocedural examination: Secondary | ICD-10-CM | POA: Diagnosis not present

## 2021-04-14 DIAGNOSIS — J90 Pleural effusion, not elsewhere classified: Secondary | ICD-10-CM | POA: Diagnosis not present

## 2021-04-14 DIAGNOSIS — R071 Chest pain on breathing: Secondary | ICD-10-CM | POA: Diagnosis not present

## 2021-04-19 ENCOUNTER — Ambulatory Visit: Payer: 59

## 2021-04-20 ENCOUNTER — Ambulatory Visit: Payer: 59 | Admitting: Oncology

## 2021-04-21 DIAGNOSIS — R9439 Abnormal result of other cardiovascular function study: Secondary | ICD-10-CM | POA: Diagnosis not present

## 2021-04-21 DIAGNOSIS — J90 Pleural effusion, not elsewhere classified: Secondary | ICD-10-CM | POA: Diagnosis not present

## 2021-04-25 ENCOUNTER — Encounter: Payer: Self-pay | Admitting: Oncology

## 2021-04-28 DIAGNOSIS — Z20822 Contact with and (suspected) exposure to covid-19: Secondary | ICD-10-CM | POA: Diagnosis not present

## 2021-04-28 DIAGNOSIS — J90 Pleural effusion, not elsewhere classified: Secondary | ICD-10-CM | POA: Diagnosis not present

## 2021-05-01 DIAGNOSIS — Z4682 Encounter for fitting and adjustment of non-vascular catheter: Secondary | ICD-10-CM | POA: Diagnosis not present

## 2021-05-01 DIAGNOSIS — J948 Other specified pleural conditions: Secondary | ICD-10-CM | POA: Diagnosis not present

## 2021-05-01 DIAGNOSIS — J939 Pneumothorax, unspecified: Secondary | ICD-10-CM | POA: Diagnosis not present

## 2021-05-01 DIAGNOSIS — C782 Secondary malignant neoplasm of pleura: Secondary | ICD-10-CM | POA: Diagnosis not present

## 2021-05-01 DIAGNOSIS — Z853 Personal history of malignant neoplasm of breast: Secondary | ICD-10-CM | POA: Diagnosis not present

## 2021-05-01 DIAGNOSIS — J90 Pleural effusion, not elsewhere classified: Secondary | ICD-10-CM | POA: Diagnosis not present

## 2021-05-01 DIAGNOSIS — T797XXA Traumatic subcutaneous emphysema, initial encounter: Secondary | ICD-10-CM | POA: Diagnosis not present

## 2021-05-01 DIAGNOSIS — J9811 Atelectasis: Secondary | ICD-10-CM | POA: Diagnosis not present

## 2021-05-01 DIAGNOSIS — J810 Acute pulmonary edema: Secondary | ICD-10-CM | POA: Diagnosis not present

## 2021-05-01 DIAGNOSIS — J811 Chronic pulmonary edema: Secondary | ICD-10-CM | POA: Diagnosis not present

## 2021-05-02 DIAGNOSIS — J939 Pneumothorax, unspecified: Secondary | ICD-10-CM | POA: Diagnosis not present

## 2021-05-02 DIAGNOSIS — J948 Other specified pleural conditions: Secondary | ICD-10-CM | POA: Diagnosis not present

## 2021-05-02 DIAGNOSIS — R918 Other nonspecific abnormal finding of lung field: Secondary | ICD-10-CM | POA: Diagnosis not present

## 2021-05-02 DIAGNOSIS — J810 Acute pulmonary edema: Secondary | ICD-10-CM | POA: Diagnosis not present

## 2021-05-02 DIAGNOSIS — J910 Malignant pleural effusion: Secondary | ICD-10-CM | POA: Diagnosis not present

## 2021-05-02 DIAGNOSIS — C782 Secondary malignant neoplasm of pleura: Secondary | ICD-10-CM | POA: Diagnosis not present

## 2021-05-02 DIAGNOSIS — J90 Pleural effusion, not elsewhere classified: Secondary | ICD-10-CM | POA: Diagnosis not present

## 2021-05-02 DIAGNOSIS — I313 Pericardial effusion (noninflammatory): Secondary | ICD-10-CM | POA: Diagnosis not present

## 2021-05-02 DIAGNOSIS — E222 Syndrome of inappropriate secretion of antidiuretic hormone: Secondary | ICD-10-CM | POA: Diagnosis not present

## 2021-05-02 DIAGNOSIS — Z4682 Encounter for fitting and adjustment of non-vascular catheter: Secondary | ICD-10-CM | POA: Diagnosis not present

## 2021-05-02 DIAGNOSIS — J982 Interstitial emphysema: Secondary | ICD-10-CM | POA: Diagnosis not present

## 2021-05-02 DIAGNOSIS — E861 Hypovolemia: Secondary | ICD-10-CM | POA: Diagnosis not present

## 2021-05-02 DIAGNOSIS — Z20822 Contact with and (suspected) exposure to covid-19: Secondary | ICD-10-CM | POA: Diagnosis not present

## 2021-05-02 DIAGNOSIS — C50912 Malignant neoplasm of unspecified site of left female breast: Secondary | ICD-10-CM | POA: Diagnosis not present

## 2021-05-02 DIAGNOSIS — J811 Chronic pulmonary edema: Secondary | ICD-10-CM | POA: Diagnosis not present

## 2021-05-02 DIAGNOSIS — J9811 Atelectasis: Secondary | ICD-10-CM | POA: Diagnosis not present

## 2021-05-02 DIAGNOSIS — T797XXA Traumatic subcutaneous emphysema, initial encounter: Secondary | ICD-10-CM | POA: Diagnosis not present

## 2021-05-04 ENCOUNTER — Telehealth: Payer: Self-pay | Admitting: *Deleted

## 2021-05-04 ENCOUNTER — Encounter: Payer: Self-pay | Admitting: Oncology

## 2021-05-04 NOTE — Telephone Encounter (Signed)
Triage msg received needing medication called into her pharmacy. I reviewed her chart. Pt also sent mychart msg. Please advise.    Hi Dr. Grayland Ormond,   Dr Bary Castilla said he forwarded to you the results of the pleuroscopy biopsy.  In light of the breast cancer cells noted, do you feel it would be best to go ahead and start the hormone blocking medication? I know you and I are to meet on Wednesday.    Jamie Wu

## 2021-05-04 NOTE — Progress Notes (Signed)
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    INTERVAL HISTORY: Patient returns to clinic today for further evaluation and treatment planning.  She recently was at [facility] with Pleurx tube insertion.  Biopsy of pleural lining confirmed recurrence.  She continues to have Pleurx catheter inserted which she drains approximately once per day.  Her shortness of breath have improved and she does not complain of pain today.  She has no neurologic complaints.  She denies any recent fevers or illnesses.  She has a good appetite and denies weight loss.  She has no chest pain, shortness of breath, cough, or hemoptysis.  She denies any nausea, vomiting, constipation, or diarrhea.  She has no urinary complaints.  Patient offers no further specific complaints today.  REVIEW OF SYSTEMS:   Review of Systems  Constitutional: Negative.  Negative for fever, malaise/fatigue and weight loss.  Respiratory: Negative.  Negative for cough, hemoptysis and shortness of breath.   Cardiovascular: Negative.  Negative for chest pain and leg swelling.  Gastrointestinal: Negative.  Negative for abdominal pain.  Genitourinary: Negative.  Negative for dysuria.  Musculoskeletal: Negative.  Negative for back pain.  Skin: Negative.  Negative for rash.  Neurological: Negative.  Negative for dizziness, focal weakness, weakness and headaches.  Psychiatric/Behavioral: Negative.  The patient is not nervous/anxious.    As per HPI. Otherwise, a complete  review of systems is negative.  PAST MEDICAL HISTORY: Past Medical History:  Diagnosis Date   Breast cancer ([place]) 2008   left breast   Personal history of malignant neoplasm of breast 2008   L LUMPECTOMY   Personal history of radiation therapy 2008   LEFT lumpectomy 2008    PAST SURGICAL HISTORY: Past Surgical History:  Procedure Laterality Date   BREAST BIOPSY Right June 25, 2012   MRI abnormality showing fibrocystic changes with microcalcifications.   BREAST EXCISIONAL BIOPSY Left 2008   breast ca   BREAST LUMPECTOMY Left 2008   CHOLECYSTECTOMY  1998   COLONOSCOPY WITH PROPOFOL N/A 07/29/2020   Procedure: COLONOSCOPY WITH PROPOFOL;  [name], MD;  [name];  Service: Endoscopy;  [name];    FAMILY HISTORY: Family History  Problem Relation Age of Onset   Colon cancer Paternal Uncle        lat3 20's    Breast cancer Neg Hx     ADVANCED DIRECTIVES (Y/N):  N  HEALTH MAINTENANCE: Social History   Tobacco Use   Smoking status: Former    Packs/day: 1.00    Years: 3.00    Pack years: 3.00    Types: Cigarettes    Quit date: 09/10/1993    Years since quitting: 27.6   Smokeless tobacco: Never  Substance Use Topics   Alcohol use: Yes    Alcohol/week: 0.0 standard drinks    Comment: occas-wine   Drug use: No     Colonoscopy:  PAP:  Bone density:  Lipid panel:  Allergies  Allergen Reactions   Contrast Media [Iodinated Diagnostic Agents] Hives    Pt get hives, itching and sneezing.    Sulfa  Antibiotics Hives    Current Outpatient Medications  Medication Sig Dispense Refill   albuterol (VENTOLIN HFA) 108 (90 Base) MCG/ACT inhaler Inhale into the lungs every 4 (four) hours as needed.     ascorbic acid (VITAMIN C) 500 MG tablet Take 1,000 mg by mouth 2 (two) times daily.     benzonatate (TESSALON) 100 MG capsule Take by mouth 3 (three) times daily as needed for cough.     Cholecalciferol 50 MCG (2000 UT) CAPS Take 1  capsule by mouth daily at 12 noon.     cyanocobalamin 1000 MCG tablet Take 1 tablet by mouth daily at 12 noon.     letrozole (FEMARA) 2.5 MG tablet Take 1 tablet (2.5 mg total) by mouth daily. 90 tablet 1   MISC NATURAL PRODUCTS PO Take 1 capsule by mouth daily. Vitamin K2 MK-7 150 mcg     Multiple Vitamin (MULTI-VITAMIN) tablet Take 1 tablet by mouth daily. Liposomal multivitamin     pantoprazole (PROTONIX) 40 MG tablet Take 40 mg by mouth daily.     famotidine (PEPCID) 20 MG tablet Take 20 mg by mouth once. (Patient not taking: Reported on 05/10/2021)     No current facility-administered medications for this visit.    OBJECTIVE: Vitals:   05/10/21 1115  BP: (!) 142/87  Pulse: (!) 106  Resp: 16  Temp: 98.1 F (36.7 C)     Body mass index is 21.12 kg/m.    ECOG FS:0 - Asymptomatic  General: Well-developed, well-nourished, no acute distress. Eyes: Pink conjunctiva, anicteric sclera. HEENT: Normocephalic, moist mucous membranes. Lungs: No audible wheezing or coughing.  Pleurx catheter in place. Heart: Regular rate and rhythm. Abdomen: Soft, nontender, no obvious distention. Musculoskeletal: No edema, cyanosis, or clubbing. Neuro: Alert, answering all questions appropriately. Cranial nerves grossly intact. Skin: No rashes or petechiae noted. Psych: Normal affect.    LAB RESULTS:  Lab Results  Component Value Date   NA 137 05/02/2017   K 3.6 05/02/2017   CL 105 05/02/2017   CO2 27 05/02/2017   GLUCOSE 88 05/02/2017   BUN 11 05/02/2017   CREATININE 0.65 05/02/2017   CALCIUM 9.1 05/02/2017   GFRNONAA >60 05/02/2017   GFRAA >60 05/02/2017    Lab Results  Component Value Date   WBC 6.0 05/02/2017   HGB 13.8 05/02/2017   HCT 40.2 05/02/2017   MCV 84.8 05/02/2017   PLT 210 05/02/2017     STUDIES: No results found.   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   PLAN:    1. 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 Patient also underwent genetic testing and was reported to be BRCA negative.  Patient's most recent CA 27-29 is 434.0.  Previously, PET scan on Jan 31, 2021 reviewed independently with several new bony lesions with minimal SUV.  Diagnosis confirmed at [facility] with pleural biopsy.  Patient reports she is postmenopausal and was recently initiated on letrozole.  Will add Ibrance 125 mg daily for 21 days with a 7-day break.  Per NCCN guidelines, patient does not require fulvestrant at this time.  Return to clinic in 2 to 3 weeks to assess her toleration of Ibrance.   2.  Genetics: By report patient is BRCA negative.  Previously, A referral has been sent to genetics to see if additional genetic testing is warranted 14 years  later. 3.  Shortness of breath: Significantly improved with Pleurx catheter.  Patient reports she drains her catheter approximately once per day and if she has under 50 mL of fluid for 3 consecutive days it will be removed.  Continue monitoring and evaluation at [facility].    Patient expressed understanding and was in agreement with this plan. She also understands that She can call clinic at any time with any questions, concerns, or complaints.   Cancer Staging Carcinoma of left breast in female, estrogen receptor positive ([place]) Staging form: Breast, AJCC 8th Edition - Clinical stage from 05/11/2021: Stage IV (rcTX, [name], pM1, [name], [name], [name], MD on 05/11/2021 Stage prefix: Recurrence  Lloyd Huger, MD   05/11/2021 6:32 AM

## 2021-05-05 ENCOUNTER — Other Ambulatory Visit: Payer: Self-pay | Admitting: *Deleted

## 2021-05-05 DIAGNOSIS — C50919 Malignant neoplasm of unspecified site of unspecified female breast: Secondary | ICD-10-CM | POA: Diagnosis not present

## 2021-05-05 DIAGNOSIS — J90 Pleural effusion, not elsewhere classified: Secondary | ICD-10-CM | POA: Diagnosis not present

## 2021-05-05 MED ORDER — LETROZOLE 2.5 MG PO TABS: 2.5000 mg | ORAL_TABLET | Freq: Every day | ORAL | 1 refills | Status: DC

## 2021-05-05 NOTE — Telephone Encounter (Signed)
My chart msg sent to patient with md's reply

## 2021-05-08 ENCOUNTER — Encounter: Payer: Self-pay | Admitting: Oncology

## 2021-05-08 ENCOUNTER — Ambulatory Visit: Payer: 59

## 2021-05-09 ENCOUNTER — Other Ambulatory Visit: Payer: Self-pay

## 2021-05-09 DIAGNOSIS — C50919 Malignant neoplasm of unspecified site of unspecified female breast: Secondary | ICD-10-CM

## 2021-05-10 ENCOUNTER — Inpatient Hospital Stay (HOSPITAL_BASED_OUTPATIENT_CLINIC_OR_DEPARTMENT_OTHER): Payer: 59 | Admitting: Oncology

## 2021-05-10 ENCOUNTER — Other Ambulatory Visit (HOSPITAL_COMMUNITY): Payer: Self-pay

## 2021-05-10 ENCOUNTER — Telehealth: Payer: Self-pay | Admitting: Pharmacist

## 2021-05-10 ENCOUNTER — Telehealth: Payer: Self-pay | Admitting: Pharmacy Technician

## 2021-05-10 ENCOUNTER — Encounter: Payer: Self-pay | Admitting: Oncology

## 2021-05-10 ENCOUNTER — Inpatient Hospital Stay: Payer: 59 | Attending: Oncology

## 2021-05-10 DIAGNOSIS — C50919 Malignant neoplasm of unspecified site of unspecified female breast: Secondary | ICD-10-CM

## 2021-05-10 DIAGNOSIS — C50912 Malignant neoplasm of unspecified site of left female breast: Secondary | ICD-10-CM

## 2021-05-10 DIAGNOSIS — Z170 Estrogen receptor positive status [ER+]: Secondary | ICD-10-CM | POA: Insufficient documentation

## 2021-05-10 DIAGNOSIS — J910 Malignant pleural effusion: Secondary | ICD-10-CM | POA: Insufficient documentation

## 2021-05-10 DIAGNOSIS — R0602 Shortness of breath: Secondary | ICD-10-CM | POA: Diagnosis not present

## 2021-05-10 DIAGNOSIS — Z780 Asymptomatic menopausal state: Secondary | ICD-10-CM | POA: Diagnosis not present

## 2021-05-10 DIAGNOSIS — Z87891 Personal history of nicotine dependence: Secondary | ICD-10-CM | POA: Diagnosis not present

## 2021-05-10 DIAGNOSIS — M899 Disorder of bone, unspecified: Secondary | ICD-10-CM | POA: Insufficient documentation

## 2021-05-10 NOTE — Progress Notes (Signed)
Patient denies new problems/concerns today.   °

## 2021-05-10 NOTE — Telephone Encounter (Addendum)
Oral Oncology Pharmacist Encounter  Received new prescription for Ibrance (palbociclib) for the treatment of recurrent metastatic breast cancer in conjunction with letrozole, planned duration until disease progression or unacceptable drug toxicity.  CBC from 05/03/21 assessed, no relevant lab abnormalities. CBC and CMP will be collected at her next office visit. Prescription dose and frequency assessed.   Current medication list in Epic reviewed, no relevant DDIs with palbociclib identified.  Evaluated chart and no patient barriers to medication adherence identified.   Prescription has been e-scribed to the [facility] for benefits analysis and approval.  Oral Oncology Clinic will continue to follow for insurance authorization, copayment issues, initial counseling and start date.  Patient agreed to treatment on 05/10/21 per MD documentation.  Darl Pikes, PharmD, BCPS, BCOP, CPP Hematology/Oncology Clinical Pharmacist Practitioner [facility] [place] [facility] [phone]  05/10/2021 1:21 PM

## 2021-05-11 ENCOUNTER — Encounter: Payer: Self-pay | Admitting: Pharmacist

## 2021-05-11 DIAGNOSIS — C7951 Secondary malignant neoplasm of bone: Secondary | ICD-10-CM | POA: Diagnosis not present

## 2021-05-11 DIAGNOSIS — J90 Pleural effusion, not elsewhere classified: Secondary | ICD-10-CM | POA: Diagnosis not present

## 2021-05-11 DIAGNOSIS — C50912 Malignant neoplasm of unspecified site of left female breast: Secondary | ICD-10-CM | POA: Diagnosis not present

## 2021-05-11 DIAGNOSIS — C50812 Malignant neoplasm of overlapping sites of left female breast: Secondary | ICD-10-CM | POA: Insufficient documentation

## 2021-05-11 DIAGNOSIS — C782 Secondary malignant neoplasm of pleura: Secondary | ICD-10-CM | POA: Diagnosis not present

## 2021-05-11 LAB — CANCER ANTIGEN 27.29: CA 27.29: 345.8 U/mL — ABNORMAL HIGH (ref 0.0–38.6)

## 2021-05-11 MED ORDER — PALBOCICLIB 125 MG PO TABS: 125.0000 mg | ORAL_TABLET | Freq: Every day | ORAL | 1 refills | Status: DC

## 2021-05-11 NOTE — Telephone Encounter (Signed)
Oral Oncology Patient Advocate Encounter   Received notification from [employer] that prior authorization for Jamie Wu is required.   PA submitted on CoverMyMeds Key [id number]  Status is pending   Oral Oncology Clinic will continue to follow.  [name] [name] Phone [phone] Fax [phone] 05/11/2021 9:01 AM

## 2021-05-11 NOTE — Telephone Encounter (Signed)
Oral Oncology Patient Advocate Encounter  Prior Authorization for Jamie Wu has been approved.    PA#  [id number] Effective dates: 05/10/21 through 05/10/22  Patient must use [facility].  Copay Card available if copay is high.  I will follow up with [facility] to verify copay and delivery status.  Oral Oncology Clinic will continue to follow.   [place] Patient [place] Phone [phone] Fax [phone] 05/11/2021 9:02 AM

## 2021-05-12 ENCOUNTER — Encounter: Payer: Self-pay | Admitting: Oncology

## 2021-05-16 ENCOUNTER — Telehealth: Payer: Self-pay | Admitting: Oncology

## 2021-05-16 ENCOUNTER — Telehealth: Payer: Self-pay | Admitting: Pharmacist

## 2021-05-16 ENCOUNTER — Other Ambulatory Visit (HOSPITAL_COMMUNITY): Payer: Self-pay

## 2021-05-16 ENCOUNTER — Telehealth: Payer: Self-pay | Admitting: Pharmacy Technician

## 2021-05-16 ENCOUNTER — Encounter: Payer: Self-pay | Admitting: Pharmacist

## 2021-05-16 DIAGNOSIS — J910 Malignant pleural effusion: Secondary | ICD-10-CM | POA: Diagnosis not present

## 2021-05-16 DIAGNOSIS — R918 Other nonspecific abnormal finding of lung field: Secondary | ICD-10-CM | POA: Diagnosis not present

## 2021-05-16 DIAGNOSIS — Z859 Personal history of malignant neoplasm, unspecified: Secondary | ICD-10-CM | POA: Diagnosis not present

## 2021-05-16 DIAGNOSIS — R0602 Shortness of breath: Secondary | ICD-10-CM | POA: Diagnosis not present

## 2021-05-16 DIAGNOSIS — J90 Pleural effusion, not elsewhere classified: Secondary | ICD-10-CM | POA: Diagnosis not present

## 2021-05-16 DIAGNOSIS — C50919 Malignant neoplasm of unspecified site of unspecified female breast: Secondary | ICD-10-CM

## 2021-05-16 MED ORDER — RIBOCICLIB SUCC (600 MG DOSE) 200 MG PO TBPK: 600.0000 mg | ORAL_TABLET | Freq: Every day | ORAL | 2 refills | Status: DC

## 2021-05-16 NOTE — Telephone Encounter (Signed)
Oral Oncology Patient Advocate Encounter  Prior Authorization for Thelma Comp has been approved.    PA# [id number] Effective dates: 05/16/21 through 05/16/22  Patient must use [facility].  Oral Oncology Clinic will continue to follow.   [place] Patient [place] Phone [phone] Fax [phone] 05/16/2021 4:24 PM

## 2021-05-16 NOTE — Telephone Encounter (Signed)
Oral Oncology Patient Advocate Encounter   Received notification from [place] that prior authorization for Thelma Comp is required.   PA submitted on CoverMyMeds Key [id number] Status is pending   Oral Oncology Clinic will continue to follow.  [place] Patient [place] Phone [phone] Fax [phone] 05/16/2021 4:22 PM

## 2021-05-16 NOTE — Telephone Encounter (Signed)
Dr. Fredricka Bonine called requesting to please speak with Dr. Grayland Ormond in regards to this patient. She can be reached on her cell phone at [phone].

## 2021-05-16 NOTE — Telephone Encounter (Signed)
Spoke with patient this afternoon. She knows that Dr. Grayland Ormond has reviewed the updated long-term data for ribociclib and is okay with the switch from palbociclib to ribociclib.  She knows that the Kisqali (ribociclib) has been approved by her insurance and the prescription has been sent to [facility]. She will come in for baseline labs and Kisqali education tomorrow morning 05/17/21.

## 2021-05-16 NOTE — Telephone Encounter (Addendum)
Oral Oncology Pharmacist Encounter  Received new prescription for Kisqali (ribociclib) for the treatment of recurrent metastatic breast cancer in conjunction with letrozole, planned duration until disease progression or unacceptable drug toxicity.  CMP and CBC labs ordered, she will need both checked every 2 weeks for the first 2 cycles, at the start of each subsequent 4 cycles, and as clinically indicated. ECG from 05/01/21, showed an QTc of 433 msec (Care Everywhere). Repeat ECG will need to be checked at day 14 of the first cycle and at the start of the second cycle. Prescription dose and frequency assessed.   Current medication list in Epic reviewed, no DDIs with ribociclib identified.  Evaluated chart and no patient barriers to medication adherence identified.   Prescription has been e-scribed to the [facility] for benefits analysis and approval.  Oral Oncology Clinic will continue to follow for insurance authorization, copayment issues, initial counseling and start date.   Darl Pikes, PharmD, BCPS, BCOP, CPP Hematology/Oncology Clinical Pharmacist Practitioner [facility] [place] [facility] [phone]  05/16/2021 3:48 PM

## 2021-05-17 ENCOUNTER — Encounter: Payer: Self-pay | Admitting: Pharmacist

## 2021-05-17 ENCOUNTER — Other Ambulatory Visit: Payer: Self-pay

## 2021-05-17 ENCOUNTER — Inpatient Hospital Stay: Payer: 59

## 2021-05-17 ENCOUNTER — Inpatient Hospital Stay: Payer: 59 | Attending: Oncology | Admitting: Pharmacist

## 2021-05-17 DIAGNOSIS — R0602 Shortness of breath: Secondary | ICD-10-CM | POA: Diagnosis not present

## 2021-05-17 DIAGNOSIS — M899 Disorder of bone, unspecified: Secondary | ICD-10-CM | POA: Diagnosis not present

## 2021-05-17 DIAGNOSIS — C50912 Malignant neoplasm of unspecified site of left female breast: Secondary | ICD-10-CM | POA: Insufficient documentation

## 2021-05-17 DIAGNOSIS — Z79811 Long term (current) use of aromatase inhibitors: Secondary | ICD-10-CM | POA: Diagnosis not present

## 2021-05-17 DIAGNOSIS — D72819 Decreased white blood cell count, unspecified: Secondary | ICD-10-CM | POA: Insufficient documentation

## 2021-05-17 DIAGNOSIS — C50919 Malignant neoplasm of unspecified site of unspecified female breast: Secondary | ICD-10-CM

## 2021-05-17 DIAGNOSIS — Z170 Estrogen receptor positive status [ER+]: Secondary | ICD-10-CM

## 2021-05-17 DIAGNOSIS — K769 Liver disease, unspecified: Secondary | ICD-10-CM | POA: Diagnosis not present

## 2021-05-17 LAB — CBC WITH DIFFERENTIAL/PLATELET
Abs Immature Granulocytes: 0.02 10*3/uL (ref 0.00–0.07)
Basophils Absolute: 0.1 10*3/uL (ref 0.0–0.1)
Basophils Relative: 2 %
Eosinophils Absolute: 0.3 10*3/uL (ref 0.0–0.5)
Eosinophils Relative: 5 %
HCT: 45.2 % (ref 36.0–46.0)
Hemoglobin: 15.6 g/dL — ABNORMAL HIGH (ref 12.0–15.0)
Immature Granulocytes: 0 %
Lymphocytes Relative: 22 %
Lymphs Abs: 1.2 10*3/uL (ref 0.7–4.0)
MCH: 29.2 pg (ref 26.0–34.0)
MCHC: 34.5 g/dL (ref 30.0–36.0)
MCV: 84.5 fL (ref 80.0–100.0)
Monocytes Absolute: 0.6 10*3/uL (ref 0.1–1.0)
Monocytes Relative: 11 %
Neutro Abs: 3.4 10*3/uL (ref 1.7–7.7)
Neutrophils Relative %: 60 %
Platelets: 328 10*3/uL (ref 150–400)
RBC: 5.35 MIL/uL — ABNORMAL HIGH (ref 3.87–5.11)
RDW: 13.1 % (ref 11.5–15.5)
WBC: 5.7 10*3/uL (ref 4.0–10.5)
nRBC: 0 % (ref 0.0–0.2)

## 2021-05-17 LAB — COMPREHENSIVE METABOLIC PANEL
ALT: 52 U/L — ABNORMAL HIGH (ref 0–44)
AST: 50 U/L — ABNORMAL HIGH (ref 15–41)
Albumin: 3.4 g/dL — ABNORMAL LOW (ref 3.5–5.0)
Alkaline Phosphatase: 110 U/L (ref 38–126)
Anion gap: 6 (ref 5–15)
BUN: 10 mg/dL (ref 6–20)
CO2: 26 mmol/L (ref 22–32)
Calcium: 9 mg/dL (ref 8.9–10.3)
Chloride: 104 mmol/L (ref 98–111)
Creatinine, Ser: 0.71 mg/dL (ref 0.44–1.00)
GFR, Estimated: >60 mL/min (ref >60)
Glucose, Bld: 81 mg/dL (ref 70–99)
Potassium: 3.5 mmol/L (ref 3.5–5.1)
Sodium: 136 mmol/L (ref 135–145)
Total Bilirubin: 1 mg/dL (ref 0.3–1.2)
Total Protein: 6.6 g/dL (ref 6.5–8.1)

## 2021-05-17 NOTE — Progress Notes (Addendum)
[place]  Telephone:([phone] Fax:([phone]  Patient Care Team: Rusty Aus, [name], [name], [name], [name], [name], MD as Consulting Physician (Hematology and Oncology)   Name of the patient: Jamie Wu  [id number]  [birth year]   Date of visit: 05/17/21  HPI: Patient is a 50 y.o. female with recurrent breast cancer. Planned treatment with Kisqali (ribociclib) and letrozole. She will start her ribociclib on 05/23/21. She has already started her letrozole.  Reason for Consult: Kisqali (ribociclib) oral chemotherapy education.   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  MEDICATIONS:  Current Outpatient Medications  Medication Sig Dispense Refill   albuterol (VENTOLIN HFA) 108 (90 Base) MCG/ACT inhaler Inhale into the lungs every 4 (four) hours as needed.     ascorbic acid (VITAMIN C) 500 MG tablet Take 1,000 mg by mouth 2 (two) times daily.     benzonatate (TESSALON) 100 MG capsule Take by mouth 3 (three) times daily as  needed for cough.     Cholecalciferol 50 MCG (2000 UT) CAPS Take 1 capsule by mouth daily at 12 noon.     cyanocobalamin 1000 MCG tablet Take 1 tablet by mouth daily at 12 noon.     famotidine (PEPCID) 20 MG tablet Take 20 mg by mouth once. (Patient not taking: Reported on 05/10/2021)     letrozole (FEMARA) 2.5 MG tablet Take 1 tablet (2.5 mg total) by mouth daily. 90 tablet 1   MISC NATURAL PRODUCTS PO Take 1 capsule by mouth daily. Vitamin K2 MK-7 150 mcg     Multiple Vitamin (MULTI-VITAMIN) tablet Take 1 tablet by mouth daily. Liposomal multivitamin     pantoprazole (PROTONIX) 40 MG tablet Take 40 mg by mouth daily.     ribociclib succ (KISQALI, 600 MG DOSE,) 200 MG Therapy Pack Take 3 tablets (600 mg total) by mouth daily. Take for 21 days on, 7 days off, repeat every 28 days. 63 tablet 2   No current facility-administered medications for this visit.    VITAL SIGNS: There were no vitals taken for this visit. There were no vitals filed for this visit.  Estimated body mass index is 21.12 kg/m as calculated from the following:   Height as of 01/19/21: 5' 9" (1.753 m).   Weight as of 05/10/21: 64.9 kg (143 lb).  LABS: CBC:    Component Value Date/Time   WBC 5.7 05/17/2021 0917   HGB 15.6 (H) 05/17/2021 0917   HCT 45.2 05/17/2021 0917   PLT 328 05/17/2021 0917   MCV 84.5 05/17/2021 0917   NEUTROABS 3.4 05/17/2021 0917  LYMPHSABS 1.2 05/17/2021 0917   MONOABS 0.6 05/17/2021 0917   EOSABS 0.3 05/17/2021 0917   BASOSABS 0.1 05/17/2021 0917   Comprehensive Metabolic Panel:    Component Value Date/Time   NA 136 05/17/2021 0917   K 3.5 05/17/2021 0917   CL 104 05/17/2021 0917   CO2 26 05/17/2021 0917   BUN 10 05/17/2021 0917   CREATININE 0.71 05/17/2021 0917   GLUCOSE 81 05/17/2021 0917   CALCIUM 9.0 05/17/2021 0917   AST 50 (H) 05/17/2021 0917   ALT 52 (H) 05/17/2021 0917   ALKPHOS 110 05/17/2021 0917   BILITOT 1.0 05/17/2021 0917   PROT 6.6 05/17/2021 0917   ALBUMIN 3.4  (L) 05/17/2021 0917     Present during today's visit: Patient only  Assessment and Plan: Start plan: Jamie Wu will start her ribociclib on 05/23/21, she has already begun her letrozole  Baseline CMP and CBC were obtained today, no relevant lab abnormalities. Repeat ECG order entered, this will be completed at her visit on 06/08/21.   Patient Education I spoke with patient for overview of new oral chemotherapy medication: ribociclib  Administration: Counseled patient on administration, dosing, side effects, monitoring, drug-food interactions, safe handling, storage, and disposal. Patient will take 3 tablets (600 mg total) by mouth daily. Take for 21 days on, 7 days off, repeat every 28 days.  Side Effects: Side effects include but not limited to: decreased wbc/hgb, N/V, fatigue, changes in liver function, and QTc prolongation.   QTc prolongation: Discussed with Jamie Wu, she knows that her current QTc is within normal limits. This will be rechecked at her next office visit and with the start of cycle 2 N/V: she will call me if she needs anything for nausea Fatigue and decreased hgb: Jamie Wu, in general, is more fatigued with the recent changes in her health. She is more short of breath with her everyday movements. She used to regularly workout but has not been able to do that since the spring. For knowing if the Jamie Wu is adding to her fatigue, we discussed using how she feels currently as her baseline. Hopefully treatment will help her feel better, but she is to let us know if she starts to feel more fatigued with initiation of ribociclib. We will be monitoring her hgb while on treatment  Drug-drug Interactions (DDI): No current DDIs with ribociclib, Jamie Wu knows to call about any new medications so that DDIs can be reevaluated  Adherence: After discussion with patient no patient barriers to medication adherence identified. Medication calendar will be mailed to Jamie Wu  with patient importance of keeping a medication schedule and plan for any missed doses.  Jamie Wu voiced understanding and appreciation. All questions answered. Medication handout provided. Patient was also provided with sections of the NCCN Patient Guidelines for Metastatic Breast Cancer.  Provided patient with Oral [facility] phone number. Patient knows to call the office with questions or concerns. Oral [facility] will continue to follow.  Patient expressed understanding and was in agreement with this plan. She also understands that She can call clinic at any time with any questions, concerns, or complaints.   Medication Access Issues: No issues patient spoke with [facility] today and scheduled delivery for Monday 05/22/21  Follow-up plan: Patient will f/u in clinic on 9/29  Thank you for allowing me to participate in the care of this patient.   Time Total: 45 mins  Visit consisted of counseling and education on dealing with issues  of symptom management in the setting of serious and potentially life-threatening illness.Greater than 50%  of this time was spent counseling and coordinating care related to the above assessment and plan.  Signed by: Darl Pikes, PharmD, BCPS, Salley Slaughter, CPP Hematology/Oncology Clinical Pharmacist Practitioner [facility] [phone]  05/17/2021 8:49 AM

## 2021-05-18 ENCOUNTER — Ambulatory Visit: Payer: 59 | Admitting: Pharmacist

## 2021-05-18 ENCOUNTER — Other Ambulatory Visit: Payer: 59

## 2021-05-18 LAB — FSH/LH
FSH: 80.4 m[IU]/mL
LH: 57.9 m[IU]/mL

## 2021-05-18 LAB — CANCER ANTIGEN 27.29: CA 27.29: 332.3 U/mL — ABNORMAL HIGH (ref 0.0–38.6)

## 2021-05-18 NOTE — Telephone Encounter (Signed)
Oral Chemotherapy Pharmacist Encounter   Patient treatment plan switched to Kisqali (ribociclib).   Darl Pikes, PharmD, BCPS, BCOP, CPP Hematology/Oncology Clinical Pharmacist [facility] [phone]  05/18/2021 10:24 AM

## 2021-05-24 ENCOUNTER — Other Ambulatory Visit: Payer: Self-pay | Admitting: Emergency Medicine

## 2021-05-24 ENCOUNTER — Encounter: Payer: Self-pay | Admitting: Oncology

## 2021-05-24 DIAGNOSIS — C50919 Malignant neoplasm of unspecified site of unspecified female breast: Secondary | ICD-10-CM

## 2021-05-25 ENCOUNTER — Telehealth: Payer: Self-pay

## 2021-05-25 NOTE — Telephone Encounter (Signed)
Received a message from [facility] insurance authorization department that PET Auth request denied stating abnormal imaging required prior to PET approval. P2P is not available for this case.  MD wants to change order to CT c/a/p with contrast.  Patient has documented allergy to contrast dye.  Will send pre meds to her preferred pharmacy  Jerene Pitch, I have sent a MyChart message to patient informing her of the need to change the PET to CT.  Please schedule.

## 2021-05-25 NOTE — Telephone Encounter (Signed)
Called patient to make her aware of the change from PET to CT.

## 2021-05-29 ENCOUNTER — Ambulatory Visit: Payer: 59

## 2021-05-29 DIAGNOSIS — C50919 Malignant neoplasm of unspecified site of unspecified female breast: Secondary | ICD-10-CM | POA: Diagnosis not present

## 2021-05-29 DIAGNOSIS — J90 Pleural effusion, not elsewhere classified: Secondary | ICD-10-CM | POA: Diagnosis not present

## 2021-06-03 NOTE — Progress Notes (Signed)
[place]  Telephone:([phone] Fax:(336) [phone]  ID: [name] OB: 04-Apr-1971  MR#: [id number]  IRW#:[id number]  Patient Care Team: Rusty Aus, [name], [name], [name], [name], [name], MD as Consulting Physician (Hematology and Oncology)   CHIEF COMPLAINT: Recurrent stage IV ER positive breast cancer.    INTERVAL HISTORY: Patient returns to clinic today for further evaluation, discussion of her imaging results, and to assess her toleration of ribociclib.  She continues to have the Pleurx catheter which drains approximately 150 mL daily.  She does not complain of pain today.  She has no neurologic complaints.  She denies any recent fevers or illnesses.  She has a good appetite and denies weight loss.  She has no chest pain, shortness of breath, cough, or hemoptysis.  She denies any nausea, vomiting, constipation, or diarrhea.  She has no urinary complaints.  Patient offers no further specific complaints today.  REVIEW OF SYSTEMS:   Review of Systems  Constitutional: Negative.  Negative for fever, malaise/fatigue and weight loss.  Respiratory: Negative.  Negative for cough, hemoptysis and shortness of breath.   Cardiovascular: Negative.  Negative for chest pain and leg swelling.  Gastrointestinal: Negative.  Negative for abdominal pain.  Genitourinary: Negative.  Negative for dysuria.  Musculoskeletal: Negative.  Negative for back pain.  Skin: Negative.  Negative for rash.  Neurological: Negative.  Negative for dizziness, focal weakness, weakness and headaches.  Psychiatric/Behavioral:  The patient is nervous/anxious.    As per HPI. Otherwise, a complete review of systems is negative.  PAST MEDICAL HISTORY: Past Medical History:  Diagnosis Date   Breast cancer ([place]) 2008   left  breast   Personal history of malignant neoplasm of breast 2008   L LUMPECTOMY   Personal history of radiation therapy 2008   LEFT lumpectomy 2008    PAST SURGICAL HISTORY: Past Surgical History:  Procedure Laterality Date   BREAST BIOPSY Right June 25, 2012   MRI abnormality showing fibrocystic changes with microcalcifications.   BREAST EXCISIONAL BIOPSY Left 2008   breast ca   BREAST LUMPECTOMY Left 2008   CHOLECYSTECTOMY  1998   COLONOSCOPY WITH PROPOFOL N/A 07/29/2020   Procedure: COLONOSCOPY WITH PROPOFOL;  [name], MD;  [name];  Service: Endoscopy;  [name];    FAMILY HISTORY: Family History  Problem Relation Age of Onset   Colon cancer Paternal Uncle        lat3 20's    Breast cancer Neg Hx     ADVANCED DIRECTIVES (Y/N):  N  HEALTH MAINTENANCE: Social History   Tobacco Use   Smoking status: Former    Packs/day: 1.00    Years: 3.00    Pack years: 3.00    Types: Cigarettes    Quit date: 09/10/1993    Years since quitting: 27.7   Smokeless tobacco: Never  Substance Use Topics   Alcohol use: Yes    Alcohol/week: 0.0 standard drinks    Comment: occas-wine   Drug use: No     Colonoscopy:  PAP:  Bone density:  Lipid panel:  Allergies  Allergen Reactions   Contrast Media [Iodinated Diagnostic Agents] Hives    Pt get hives, itching and sneezing.    Sulfa Antibiotics Hives    Current Outpatient Medications  Medication Sig Dispense Refill   albuterol (VENTOLIN HFA) 108 (90  Base) MCG/ACT inhaler Inhale into the lungs every 4 (four) hours as needed.     ascorbic acid (VITAMIN C) 500 MG tablet Take 1,000 mg by mouth 2 (two) times daily.     benzonatate (TESSALON) 100 MG capsule Take by mouth 3 (three) times daily as needed for cough.     Cholecalciferol 50 MCG (2000 UT) CAPS Take 1 capsule by mouth daily at 12 noon.     cyanocobalamin 1000 MCG tablet Take 1 tablet by mouth daily at 12 noon.     letrozole  (FEMARA) 2.5 MG tablet Take 1 tablet (2.5 mg total) by mouth daily. 90 tablet 1   MISC NATURAL PRODUCTS PO Take 1 capsule by mouth daily. Vitamin K2 MK-7 150 mcg     Multiple Vitamin (MULTI-VITAMIN) tablet Take 1 tablet by mouth daily. Liposomal multivitamin     pantoprazole (PROTONIX) 40 MG tablet Take 40 mg by mouth daily.     ribociclib succ (KISQALI, 600 MG DOSE,) 200 MG Therapy Pack Take 3 tablets (600 mg total) by mouth daily. Take for 21 days on, 7 days off, repeat every 28 days. 63 tablet 2   famotidine (PEPCID) 20 MG tablet Take 20 mg by mouth once. (Patient not taking: Reported on 06/08/2021)     No current facility-administered medications for this visit.    OBJECTIVE: There were no vitals filed for this visit.    There is no height or weight on file to calculate BMI.    ECOG FS:0 - Asymptomatic  General: Well-developed, well-nourished, no acute distress. Eyes: Pink conjunctiva, anicteric sclera. HEENT: Normocephalic, moist mucous membranes. Lungs: No audible wheezing or coughing.  Left pleural catheter in place. Heart: Regular rate and rhythm. Abdomen: Soft, nontender, no obvious distention. Musculoskeletal: No edema, cyanosis, or clubbing. Neuro: Alert, answering all questions appropriately. Cranial nerves grossly intact. Skin: No rashes or petechiae noted. Psych: Normal affect.    LAB RESULTS:  Lab Results  Component Value Date   NA 138 06/08/2021   K 3.7 06/08/2021   CL 103 06/08/2021   CO2 27 06/08/2021   GLUCOSE 83 06/08/2021   BUN 16 06/08/2021   CREATININE 0.90 06/08/2021   CALCIUM 9.2 06/08/2021   PROT 6.9 06/08/2021   ALBUMIN 3.7 06/08/2021   AST 44 (H) 06/08/2021   ALT 39 06/08/2021   ALKPHOS 95 06/08/2021   BILITOT 1.4 (H) 06/08/2021   GFRNONAA >60 06/08/2021   GFRAA >60 05/02/2017    Lab Results  Component Value Date   WBC 2.2 (L) 06/08/2021   NEUTROABS 1.2 (L) 06/08/2021   HGB 14.6 06/08/2021   HCT 42.2 06/08/2021   MCV 84.9 06/08/2021    PLT 179 06/08/2021     STUDIES: CT CHEST ABDOMEN PELVIS W CONTRAST  Result Date: 06/06/2021 CLINICAL DATA:  A 50 year old female with history of breast cancer, surveillance/follow-up evaluation, noted to have pleural fluid on previous imaging post PleurX catheter placement. EXAM: CT CHEST, ABDOMEN, AND PELVIS WITH CONTRAST TECHNIQUE: Multidetector CT imaging of the chest, abdomen and pelvis was performed following the standard protocol during bolus administration of intravenous contrast. CONTRAST:  61mL OMNIPAQUE IOHEXOL 350 MG/ML SOLN COMPARISON:  Jan 10, 2021.  March 27, 2021. FINDINGS: CT CHEST FINDINGS Cardiovascular: Small pericardial effusion. Normal heart size. No nodularity of the pericardium grossly. Normal caliber of the thoracic aorta. Normal appearance of central pulmonary vessels. Mediastinum/Nodes: Post LEFT axillary dissection. No adenopathy at the thoracic inlet. No adenopathy in the mediastinum. Esophagus grossly normal. No hilar adenopathy. Lungs/Pleura:  Interval placement of a LEFT-sided PleurX catheter since May of 2022 reportedly present based on outside imaging reports since at least August of 2022. No visible pneumothorax. Small amount of subcutaneous emphysema along the LEFT chest wall present by report on previous imaging, only small amount seen inferior to the catheter on the current study. RIGHT-sided pleural effusion has increased since May of 2022 now with large pleural effusion layering posteriorly in the RIGHT chest. Mild ground-glass and septal thickening in the RIGHT chest is markedly improved since previous imaging. Musculoskeletal: See below for full musculoskeletal detail. Mild stranding about LEFT-sided costochondral elements with indistinct fat planes anterior to the LEFT fifth costochondral junction shows no change. This is associated with generalized mild mediastinal stranding. CT ABDOMEN PELVIS FINDINGS Hepatobiliary: Hepatic metastatic disease is present, new lesion in  the medial aspect of the RIGHT hemi liver (image 70/2) 15 mm. Numerous other foci of low and intermediate attenuation in both the LEFT and RIGHT hepatic lobes, for instance a LEFT hepatic lobe lesion on image 50/2 measuring 8 mm. At least 15 new lesions in the liver compared to more remote imaging. The portal vein is patent. Post cholecystectomy. No biliary duct distension. RIGHT hepatic cyst as before. Pancreas: Normal, without mass, inflammation or ductal dilatation. Spleen: Spleen normal size and contour. Adrenals/Urinary Tract: Adrenal glands are normal. Symmetric renal enhancement. No hydronephrosis. Small cyst in the interpolar LEFT kidney without change compared to imaging dating back to 2016. Urinary bladder with smooth contours without perivesical stranding. Stomach/Bowel: No acute gastrointestinal process. Appendix is normal. Vascular/Lymphatic: Aorta with smooth contours. IVC with smooth contours. No aneurysmal dilation of the abdominal aorta. There is no gastrohepatic or hepatoduodenal ligament lymphadenopathy. No retroperitoneal or mesenteric lymphadenopathy. No pelvic sidewall lymphadenopathy. Reproductive: Unremarkable by CT. Other: No ascites. Musculoskeletal: Scattered areas of sclerosis throughout the spine similar to prior PET imaging. Unchanged appearance of sclerosis in the LEFT proximal femur (image 125/2, this measures 11 mm. No new sclerotic lesions. No destructive bone process. IMPRESSION: Numerous new hepatic lesions compatible with metastatic disease, [name], MRI correlation could be helpful for further assessment as warranted. Interval placement of a LEFT-sided PleurX catheter since May of 2022, [name], small amount of subcutaneous emphysema inferior to the tube on the current study. Correlate with any tube manipulation or symptoms in  this area. No visible pneumothorax. Large RIGHT-sided pleural effusion increased since May of 2022, not associated with overt nodularity. Improvement with respect to ground-glass and septal thickening in the RIGHT chest. Correlate with any ongoing respiratory symptoms. Scattered sclerotic lesions with similar appearance seen throughout the spine and in the pelvis and LEFT proximal femur. Small pericardial effusion without nodularity. Electronically Signed   By: Zetta Bills M.D.   On: 06/06/2021 16:34     ASSESSMENT: Recurrent stage IV ER positive breast cancer.   PLAN:    1. Recurrent stage IV ER positive breast cancer: Patient underwent lumpectomy in 2008 followed by [name].  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 Patient also underwent genetic testing and was reported to be BRCA negative.  CT scan results from June 06, 2021 reviewed independently and reported as above with at least 15 new liver lesions along with sclerotic bone lesions which are highly suspicious for metastatic disease.  Her most recent CA 27-29 has trended down  to 351.  Although the liver and bone lesions are new, she only initiated treatment with  ribociclib approximately 2 weeks ago so difficult to call this progression.  Continue current treatment with ribociclib 21 days on 7 days off with daily letrozole.  We discussed adding Xgeva or Zometa for presumed metastatic bone lesions, but patient declined.  She did agree to take calcium and vitamin D supplementation.  Will reimage in approximately 3 months.  Return to clinic in 2 weeks for repeat laboratory work, further evaluation, initiation of cycle 2.  At that point, can evaluate patient every 4 weeks at the beginning of each treatment cycle.  2.  Genetics: By report patient is BRCA negative.  Previously, A referral has been sent to genetics to see if additional genetic testing is warranted 14 years later. 3.  Shortness of breath:  Significantly improved with Pleurx catheter.   4.  Elevated bilirubin: Mild, monitor. 5.  Leukopenia: Likely secondary to treatment, monitor.   Patient expressed understanding and was in agreement with this plan. She also understands that She can call clinic at any time with any questions, concerns, or complaints.   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

## 2021-06-06 ENCOUNTER — Ambulatory Visit
Admission: RE | Admit: 2021-06-06 | Discharge: 2021-06-06 | Disposition: A | Discharge status: Home / Self Care | Payer: 59 | Source: Ambulatory Visit | Attending: Oncology | Admitting: Oncology

## 2021-06-06 DIAGNOSIS — J982 Interstitial emphysema: Secondary | ICD-10-CM | POA: Diagnosis not present

## 2021-06-06 DIAGNOSIS — Z853 Personal history of malignant neoplasm of breast: Secondary | ICD-10-CM | POA: Insufficient documentation

## 2021-06-06 DIAGNOSIS — N281 Cyst of kidney, acquired: Secondary | ICD-10-CM | POA: Diagnosis not present

## 2021-06-06 DIAGNOSIS — J90 Pleural effusion, not elsewhere classified: Secondary | ICD-10-CM | POA: Diagnosis not present

## 2021-06-06 DIAGNOSIS — K7689 Other specified diseases of liver: Secondary | ICD-10-CM | POA: Diagnosis not present

## 2021-06-06 DIAGNOSIS — J929 Pleural plaque without asbestos: Secondary | ICD-10-CM | POA: Diagnosis not present

## 2021-06-06 DIAGNOSIS — I313 Pericardial effusion (noninflammatory): Secondary | ICD-10-CM | POA: Diagnosis not present

## 2021-06-06 DIAGNOSIS — Z9049 Acquired absence of other specified parts of digestive tract: Secondary | ICD-10-CM | POA: Diagnosis not present

## 2021-06-06 MED ORDER — IOHEXOL 350 MG/ML SOLN
75.0000 mL | Freq: Once | INTRAVENOUS | Status: AC | PRN
  Administered 2021-06-06: 75 mL via INTRAVENOUS

## 2021-06-07 ENCOUNTER — Telehealth: Payer: Self-pay | Admitting: *Deleted

## 2021-06-07 ENCOUNTER — Ambulatory Visit: Admission: RE | Admit: 2021-06-07 | Payer: 59 | Source: Ambulatory Visit

## 2021-06-07 NOTE — Telephone Encounter (Signed)
Called report  IMPRESSION: Numerous new hepatic lesions compatible with metastatic disease, [name], [name], [name], [name], not associated with overt nodularity.   Improvement with respect to ground-glass and septal thickening in the RIGHT chest. Correlate with any ongoing respiratory symptoms.   Scattered sclerotic lesions with similar appearance seen throughout the spine and in the pelvis and LEFT proximal femur.   Small pericardial effusion without nodularity.     Electronically Signed   By: Zetta Bills M.D.   On: 06/06/2021 16:34

## 2021-06-07 NOTE — Progress Notes (Addendum)
[place]  [phone] [phone]  Patient Care Team: Rusty Aus, [name], [name], [name], [name], [name], MD as Consulting Physician (Hematology and Oncology)   Name of the patient: Jamie Wu  [id number]  June 07, 1971   Date of visit: 06/08/21  HPI: Patient is a 50 y.o. female with recurrent metastatic breast cancer. Currently treated with Kisqali (ribociclib) and letrozole. Started ribociclib on 05/22/21. She started her letrozole in 04/2021.  Reason for Consult: Oral chemotherapy follow-up for ribociclib therapy.   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  MEDICATIONS:  Current Outpatient Medications  Medication Sig Dispense Refill   albuterol (VENTOLIN HFA) 108 (90 Base) MCG/ACT inhaler Inhale into the lungs every 4 (four) hours as needed.     ascorbic acid (VITAMIN C) 500 MG tablet Take 1,000 mg by mouth 2 (two) times daily.     benzonatate (TESSALON) 100 MG capsule Take by mouth 3 (three) times daily  as needed for cough.     Cholecalciferol 50 MCG (2000 UT) CAPS Take 1 capsule by mouth daily at 12 noon.     cyanocobalamin 1000 MCG tablet Take 1 tablet by mouth daily at 12 noon.     famotidine (PEPCID) 20 MG tablet Take 20 mg by mouth once. (Patient not taking: Reported on 06/08/2021)     letrozole (FEMARA) 2.5 MG tablet Take 1 tablet (2.5 mg total) by mouth daily. 90 tablet 1   MISC NATURAL PRODUCTS PO Take 1 capsule by mouth daily. Vitamin K2 MK-7 150 mcg     Multiple Vitamin (MULTI-VITAMIN) tablet Take 1 tablet by mouth daily. Liposomal multivitamin     pantoprazole (PROTONIX) 40 MG tablet Take 40 mg by mouth daily.     ribociclib succ (KISQALI, 600 MG DOSE,) 200 MG Therapy Pack Take 3 tablets (600 mg total) by mouth daily. Take for 21 days on, 7 days off, repeat every 28 days. 63 tablet 2   No current facility-administered medications for this visit.    VITAL SIGNS: There were no vitals taken for this visit. There were no vitals filed for this visit.  Estimated body mass index is 21.12 kg/m as calculated from the following:   Height as of 01/19/21: 5' 9" (1.753 m).   Weight as of 05/10/21: 64.9 kg (143 lb).  LABS: CBC:    Component Value Date/Time   WBC 2.2 (L) 06/08/2021 1003   HGB 14.6 06/08/2021 1003   HCT 42.2 06/08/2021 1003   PLT 179 06/08/2021 1003   MCV 84.9 06/08/2021 1003   NEUTROABS 1.2 (L) 06/08/2021 1003  LYMPHSABS 0.8 06/08/2021 1003   MONOABS 0.1 06/08/2021 1003   EOSABS 0.0 06/08/2021 1003   BASOSABS 0.0 06/08/2021 1003   Comprehensive Metabolic Panel:    Component Value Date/Time   NA 138 06/08/2021 1003   K 3.7 06/08/2021 1003   CL 103 06/08/2021 1003   CO2 27 06/08/2021 1003   BUN 16 06/08/2021 1003   CREATININE 0.90 06/08/2021 1003   GLUCOSE 83 06/08/2021 1003   CALCIUM 9.2 06/08/2021 1003   AST 44 (H) 06/08/2021 1003   ALT 39 06/08/2021 1003   ALKPHOS 95 06/08/2021 1003   BILITOT 1.4 (H) 06/08/2021 1003   PROT 6.9 06/08/2021 1003   ALBUMIN  3.7 06/08/2021 1003     Present during today's visit: Patient and her husband, Hal.   Assessment and Plan: Reviewed CMP and CBC, neutrophils have decreased and total bilirubin has slightly increased. Will continue to monitor. If neutrophils <1.0 K/uL at next office visit, may need to delay the start of cycle 2. Dose can remain the same.   ECG rechecked at today's appointment. QTc WNL, will repeat at next office visit.  Discussed the initiation of bone directed therapy. Patient elects to hold off on adding treatment for now, she is taking vitamin D and calcium supplements.   Continue ribociclib 600 mg.    Oral Chemotherapy Side Effect/Intolerance:  Itching: patient reported light itching on chest, not currently too bothersome Diarrhea: reported loose stools in the morning that solidify throughout the day, currently manageable without the use of loperamide Nausea: reported one instance of nausea when she took her ribociclib on an empty stomach, she now takes this with food and the nausea has not occurred again No reported fatigue  Oral Chemotherapy Adherence: No reported missed doses. No patient barriers to medication adherence identified.   New medications: None reported.  Medication Access Issues: [facility] with [facility].  Patient expressed understanding and was in agreement with this plan. She also understands that She can call clinic at any time with any questions, concerns, or complaints.   Follow-up plan: RTC in 2 weeks.  Thank you for allowing me to participate in the care of this very pleasant patient.   Time Total: 20 minutes  Visit consisted of counseling and education on dealing with issues of symptom management in the setting of serious and potentially life-threatening illness.Greater than 50%  of this time was spent counseling and coordinating care related to the above assessment and plan.  Signed by: Alyson N. Leonard, PharmD, BCPS, BCOP,  CPP Hematology/Oncology Clinical Pharmacist Practitioner [facility] [phone]  06/08/2021 12:05 PM 

## 2021-06-08 ENCOUNTER — Inpatient Hospital Stay: Payer: 59 | Admitting: Pharmacist

## 2021-06-08 ENCOUNTER — Other Ambulatory Visit: Payer: Self-pay

## 2021-06-08 ENCOUNTER — Inpatient Hospital Stay (HOSPITAL_BASED_OUTPATIENT_CLINIC_OR_DEPARTMENT_OTHER): Payer: 59 | Admitting: Oncology

## 2021-06-08 ENCOUNTER — Inpatient Hospital Stay: Payer: 59

## 2021-06-08 DIAGNOSIS — C50812 Malignant neoplasm of overlapping sites of left female breast: Secondary | ICD-10-CM | POA: Diagnosis not present

## 2021-06-08 DIAGNOSIS — M899 Disorder of bone, unspecified: Secondary | ICD-10-CM | POA: Diagnosis not present

## 2021-06-08 DIAGNOSIS — C50919 Malignant neoplasm of unspecified site of unspecified female breast: Secondary | ICD-10-CM | POA: Diagnosis not present

## 2021-06-08 DIAGNOSIS — K769 Liver disease, unspecified: Secondary | ICD-10-CM | POA: Diagnosis not present

## 2021-06-08 DIAGNOSIS — Z79811 Long term (current) use of aromatase inhibitors: Secondary | ICD-10-CM | POA: Diagnosis not present

## 2021-06-08 DIAGNOSIS — Z170 Estrogen receptor positive status [ER+]: Secondary | ICD-10-CM

## 2021-06-08 DIAGNOSIS — C50912 Malignant neoplasm of unspecified site of left female breast: Secondary | ICD-10-CM

## 2021-06-08 DIAGNOSIS — R0602 Shortness of breath: Secondary | ICD-10-CM | POA: Diagnosis not present

## 2021-06-08 DIAGNOSIS — D72819 Decreased white blood cell count, unspecified: Secondary | ICD-10-CM | POA: Diagnosis not present

## 2021-06-08 LAB — CBC WITH DIFFERENTIAL/PLATELET
Abs Immature Granulocytes: 0.01 10*3/uL (ref 0.00–0.07)
Basophils Absolute: 0 10*3/uL (ref 0.0–0.1)
Basophils Relative: 2 %
Eosinophils Absolute: 0 10*3/uL (ref 0.0–0.5)
Eosinophils Relative: 1 %
HCT: 42.2 % (ref 36.0–46.0)
Hemoglobin: 14.6 g/dL (ref 12.0–15.0)
Immature Granulocytes: 1 %
Lymphocytes Relative: 36 %
Lymphs Abs: 0.8 10*3/uL (ref 0.7–4.0)
MCH: 29.4 pg (ref 26.0–34.0)
MCHC: 34.6 g/dL (ref 30.0–36.0)
MCV: 84.9 fL (ref 80.0–100.0)
Monocytes Absolute: 0.1 10*3/uL (ref 0.1–1.0)
Monocytes Relative: 6 %
Neutro Abs: 1.2 10*3/uL — ABNORMAL LOW (ref 1.7–7.7)
Neutrophils Relative %: 54 %
Platelets: 179 10*3/uL (ref 150–400)
RBC: 4.97 MIL/uL (ref 3.87–5.11)
RDW: 12.8 % (ref 11.5–15.5)
WBC: 2.2 10*3/uL — ABNORMAL LOW (ref 4.0–10.5)
nRBC: 0 % (ref 0.0–0.2)

## 2021-06-08 LAB — COMPREHENSIVE METABOLIC PANEL
ALT: 39 U/L (ref 0–44)
AST: 44 U/L — ABNORMAL HIGH (ref 15–41)
Albumin: 3.7 g/dL (ref 3.5–5.0)
Alkaline Phosphatase: 95 U/L (ref 38–126)
Anion gap: 8 (ref 5–15)
BUN: 16 mg/dL (ref 6–20)
CO2: 27 mmol/L (ref 22–32)
Calcium: 9.2 mg/dL (ref 8.9–10.3)
Chloride: 103 mmol/L (ref 98–111)
Creatinine, Ser: 0.9 mg/dL (ref 0.44–1.00)
GFR, Estimated: >60 mL/min (ref >60)
Glucose, Bld: 83 mg/dL (ref 70–99)
Potassium: 3.7 mmol/L (ref 3.5–5.1)
Sodium: 138 mmol/L (ref 135–145)
Total Bilirubin: 1.4 mg/dL — ABNORMAL HIGH (ref 0.3–1.2)
Total Protein: 6.9 g/dL (ref 6.5–8.1)

## 2021-06-08 LAB — VITAMIN D 25 HYDROXY (VIT D DEFICIENCY, FRACTURES): Vit D, 25-Hydroxy: 48.31 ng/mL (ref 30–100)

## 2021-06-08 NOTE — Progress Notes (Signed)
Pt wanting to discuss CT scan results. Pt states that difficulty breathing has improved but has noticed increased shortness of breath x 2-3 days. Pt also c/o difficulty falling and staying to sleep x1 wk.

## 2021-06-08 NOTE — Progress Notes (Signed)
I was asked by Dr. Grayland Ormond to assess the pt's skin at the pleurx cath site. Pt c/o adhesive irritation from the tegaderm. Also c/o sutures bothering her that have been in place since her surgery in August.  The [place] sutures had already been removed from the Pleurx cath at [facility] by Dr. Kerin Ransom. However, pt was told that the sutures at the thorascopy site were to dissolve. Per patient, the sutures had not dissolved and are pulling at her skin.Husband had attempted to remove one of the sutures yesterday, but was unable to get the suture removed with out a suture kits.   Sutures intact x 5 on left thorascopy site.  No signs of infection. Adhesive irritation noted around the Tegaderm dressing. Old dressing for pleurx cath removed. Pleurx cath dressing was changed per [facility] policy. Barrier film swab was also used in attempt to help with Tegaderm skin adhesive reaction. Remaining Sutures at thorascopy site removed per [facility] policy and steristrip was placed.  Pt was given a few extra Barrier film swabs to use to prevent further skin irritation for the tegaderm. Husband will attempt to personally order a box of this the barrier swabs as well.  Pt thanked me for assisting her with her pleurx cath. She understands that if she has future concerns with the pleurx cath, she can reach out to me in the clinic. I will be happy to help her in the future. Patient is optimistic that the pleurx could potentially be removed in the near future. She is only draining 150 ml daily. She is draining daily per the orders from [facility]. She was instructed to call [facility] if she is draining less than 50 ml daily as the catheter may need to be drained less frequently or see if the catheter could be removed. [facility] will manage these orders.

## 2021-06-09 LAB — CANCER ANTIGEN 27.29: CA 27.29: 351 U/mL — ABNORMAL HIGH (ref 0.0–38.6)

## 2021-06-13 ENCOUNTER — Telehealth: Payer: Self-pay | Admitting: Oncology

## 2021-06-13 NOTE — Telephone Encounter (Signed)
Returned call to patient requesting to transfer her care from Dr. Grayland Ormond to [name]. Per communication with both clinical teams and leadership ok for patient to transfer. Patient notified of change in provider. She is scheduled to see [name] on 10/13.

## 2021-06-20 ENCOUNTER — Ambulatory Visit: Payer: 59 | Admitting: Pharmacist

## 2021-06-20 ENCOUNTER — Other Ambulatory Visit: Payer: 59

## 2021-06-20 ENCOUNTER — Ambulatory Visit: Payer: 59 | Admitting: Oncology

## 2021-06-22 ENCOUNTER — Inpatient Hospital Stay: Payer: 59

## 2021-06-22 ENCOUNTER — Other Ambulatory Visit: Payer: Self-pay

## 2021-06-22 ENCOUNTER — Inpatient Hospital Stay: Payer: 59 | Attending: Internal Medicine | Admitting: Internal Medicine

## 2021-06-22 ENCOUNTER — Other Ambulatory Visit: Payer: Self-pay | Admitting: *Deleted

## 2021-06-22 ENCOUNTER — Inpatient Hospital Stay: Payer: 59 | Admitting: Pharmacist

## 2021-06-22 ENCOUNTER — Encounter: Payer: Self-pay | Admitting: Internal Medicine

## 2021-06-22 DIAGNOSIS — J982 Interstitial emphysema: Secondary | ICD-10-CM | POA: Insufficient documentation

## 2021-06-22 DIAGNOSIS — C50912 Malignant neoplasm of unspecified site of left female breast: Secondary | ICD-10-CM

## 2021-06-22 DIAGNOSIS — C7951 Secondary malignant neoplasm of bone: Secondary | ICD-10-CM | POA: Insufficient documentation

## 2021-06-22 DIAGNOSIS — R590 Localized enlarged lymph nodes: Secondary | ICD-10-CM | POA: Insufficient documentation

## 2021-06-22 DIAGNOSIS — R5383 Other fatigue: Secondary | ICD-10-CM

## 2021-06-22 DIAGNOSIS — C50811 Malignant neoplasm of overlapping sites of right female breast: Secondary | ICD-10-CM | POA: Diagnosis not present

## 2021-06-22 DIAGNOSIS — C50812 Malignant neoplasm of overlapping sites of left female breast: Secondary | ICD-10-CM

## 2021-06-22 DIAGNOSIS — C782 Secondary malignant neoplasm of pleura: Secondary | ICD-10-CM | POA: Insufficient documentation

## 2021-06-22 DIAGNOSIS — Z79899 Other long term (current) drug therapy: Secondary | ICD-10-CM | POA: Insufficient documentation

## 2021-06-22 DIAGNOSIS — J90 Pleural effusion, not elsewhere classified: Secondary | ICD-10-CM | POA: Insufficient documentation

## 2021-06-22 DIAGNOSIS — Z170 Estrogen receptor positive status [ER+]: Secondary | ICD-10-CM | POA: Diagnosis not present

## 2021-06-22 DIAGNOSIS — Z87891 Personal history of nicotine dependence: Secondary | ICD-10-CM | POA: Diagnosis not present

## 2021-06-22 DIAGNOSIS — C50919 Malignant neoplasm of unspecified site of unspecified female breast: Secondary | ICD-10-CM

## 2021-06-22 DIAGNOSIS — Z01818 Encounter for other preprocedural examination: Secondary | ICD-10-CM | POA: Diagnosis not present

## 2021-06-22 LAB — CBC WITH DIFFERENTIAL/PLATELET
Abs Immature Granulocytes: 0 10*3/uL (ref 0.00–0.07)
Basophils Absolute: 0.1 10*3/uL (ref 0.0–0.1)
Basophils Relative: 3 %
Eosinophils Absolute: 0 10*3/uL (ref 0.0–0.5)
Eosinophils Relative: 1 %
HCT: 35.6 % — ABNORMAL LOW (ref 36.0–46.0)
Hemoglobin: 12.6 g/dL (ref 12.0–15.0)
Immature Granulocytes: 0 %
Lymphocytes Relative: 28 %
Lymphs Abs: 0.7 10*3/uL (ref 0.7–4.0)
MCH: 30.2 pg (ref 26.0–34.0)
MCHC: 35.4 g/dL (ref 30.0–36.0)
MCV: 85.4 fL (ref 80.0–100.0)
Monocytes Absolute: 0.5 10*3/uL (ref 0.1–1.0)
Monocytes Relative: 18 %
Neutro Abs: 1.2 10*3/uL — ABNORMAL LOW (ref 1.7–7.7)
Neutrophils Relative %: 50 %
Platelets: 214 10*3/uL (ref 150–400)
RBC: 4.17 MIL/uL (ref 3.87–5.11)
RDW: 14.5 % (ref 11.5–15.5)
WBC: 2.5 10*3/uL — ABNORMAL LOW (ref 4.0–10.5)
nRBC: 0 % (ref 0.0–0.2)

## 2021-06-22 LAB — COMPREHENSIVE METABOLIC PANEL
ALT: 20 U/L (ref 0–44)
AST: 26 U/L (ref 15–41)
Albumin: 3.6 g/dL (ref 3.5–5.0)
Alkaline Phosphatase: 84 U/L (ref 38–126)
Anion gap: 6 (ref 5–15)
BUN: 21 mg/dL — ABNORMAL HIGH (ref 6–20)
CO2: 25 mmol/L (ref 22–32)
Calcium: 9 mg/dL (ref 8.9–10.3)
Chloride: 106 mmol/L (ref 98–111)
Creatinine, Ser: 0.9 mg/dL (ref 0.44–1.00)
GFR, Estimated: >60 mL/min (ref >60)
Glucose, Bld: 89 mg/dL (ref 70–99)
Potassium: 4.3 mmol/L (ref 3.5–5.1)
Sodium: 137 mmol/L (ref 135–145)
Total Bilirubin: 1 mg/dL (ref 0.3–1.2)
Total Protein: 6.5 g/dL (ref 6.5–8.1)

## 2021-06-22 LAB — VITAMIN D 25 HYDROXY (VIT D DEFICIENCY, FRACTURES): Vit D, 25-Hydroxy: 52.94 ng/mL (ref 30–100)

## 2021-06-22 NOTE — Progress Notes (Signed)
[place]  Telephone:([phone] Fax:([phone]  Patient Care Team: Rusty Aus, [name], [name], [name], [name], [name], MD as Consulting Physician (Hematology and Oncology)   Name of the patient: Jamie Wu  [id number]  24-May-1971   Date of visit: 06/22/21  HPI: Patient is a 50 y.o. female with recurrent metastatic breast cancer. Currently treated with Kisqali (ribociclib) and letrozole. Started ribociclib on 05/22/21. She started her letrozole in 04/2021.  Reason for Consult: Oral chemotherapy follow-up for ribociclib therapy.   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  MEDICATIONS:  Current Outpatient Medications  Medication Sig Dispense Refill   albuterol (VENTOLIN HFA) 108 (90 Base) MCG/ACT inhaler Inhale into the lungs every 4 (four) hours as needed.     ascorbic acid (VITAMIN C) 500 MG tablet Take 1,000 mg by mouth 2 (two) times daily.     benzonatate (TESSALON) 100 MG capsule Take by mouth 3 (three) times daily  as needed for cough.     Cholecalciferol 50 MCG (2000 UT) CAPS Take 1 capsule by mouth daily at 12 noon.     cyanocobalamin 1000 MCG tablet Take 1 tablet by mouth daily at 12 noon.     famotidine (PEPCID) 20 MG tablet Take 20 mg by mouth once. (Patient not taking: Reported on 06/08/2021)     letrozole (FEMARA) 2.5 MG tablet Take 1 tablet (2.5 mg total) by mouth daily. 90 tablet 1   MISC NATURAL PRODUCTS PO Take 1 capsule by mouth daily. Vitamin K2 MK-7 150 mcg     Multiple Vitamin (MULTI-VITAMIN) tablet Take 1 tablet by mouth daily. Liposomal multivitamin     pantoprazole (PROTONIX) 40 MG tablet Take 40 mg by mouth daily.     ribociclib succ (KISQALI, 600 MG DOSE,) 200 MG Therapy Pack Take 3 tablets (600 mg total) by mouth daily. Take for 21 days on, 7 days off, repeat every 28 days. 63 tablet 2   No current facility-administered medications for this visit.    VITAL SIGNS: There were no vitals taken for this visit. There were no vitals filed for this visit.  Estimated body mass index is 21.12 kg/m as calculated from the following:   Height as of 01/19/21: 5' 9" (1.753 m).   Weight as of 05/10/21: 64.9 kg (143 lb).  LABS: CBC:    Component Value Date/Time   WBC 2.2 (L) 06/08/2021 1003   HGB 14.6 06/08/2021 1003   HCT 42.2 06/08/2021 1003   PLT 179 06/08/2021 1003   MCV 84.9 06/08/2021 1003   NEUTROABS 1.2 (L) 06/08/2021 1003  LYMPHSABS 0.8 06/08/2021 1003   MONOABS 0.1 06/08/2021 1003   EOSABS 0.0 06/08/2021 1003   BASOSABS 0.0 06/08/2021 1003   Comprehensive Metabolic Panel:    Component Value Date/Time   NA 138 06/08/2021 1003   K 3.7 06/08/2021 1003   CL 103 06/08/2021 1003   CO2 27 06/08/2021 1003   BUN 16 06/08/2021 1003   CREATININE 0.90 06/08/2021 1003   GLUCOSE 83 06/08/2021 1003   CALCIUM 9.2 06/08/2021 1003   AST 44 (H) 06/08/2021 1003   ALT 39 06/08/2021 1003   ALKPHOS 95 06/08/2021 1003   BILITOT 1.4 (H) 06/08/2021 1003   PROT 6.9 06/08/2021 1003   ALBUMIN  3.7 06/08/2021 1003     Present during today's visit: Patient only   Assessment and Plan: Reviewed CMP and CBC, neutrophils have decreased. Will continue to monitor.   ECG rechecked at today's appointment, QTc wnl  Continue ribociclib 600 mg 21 days on/7 days off. Today is C2D4.   Oral Chemotherapy Side Effect/Intolerance:  Itching: has not occurred so far this cycle No reported fatigue, diarrhea, or nausea  Other: Patient is see a holistic care provider who suggested the patient take high dose IV Vitamin C and ivermectin. She is also considering starting DoTERRA supplements. She brought handouts of the DoTERRA supplements she is considering, Will review the contains of the [facility] products for DDI with ribociclib. Suggested again the use of ivermectin along with her ribociclib treatment. I let her know I would have to look more into the IV vitamin C.   Oral Chemotherapy Adherence: No reported missed doses. No patient barriers to medication adherence identified.   New medications: None reported.  Medication Access Issues: [facility].  Patient expressed understanding and was in agreement with this plan. She also understands that She can call clinic at any time with any questions, concerns, or complaints.   Follow-up plan: RTC in 2 weeks for labs and 4 weeks for the start of cycle 3.  Thank you for allowing me to participate in the care of this very pleasant patient.   Time Total: 20 minutes  Visit consisted of counseling and education on dealing with issues of symptom management in the setting of serious and potentially life-threatening illness.Greater than 50%  of this time was spent counseling and coordinating care related to the above assessment and plan.  Signed by: Darl Pikes, PharmD, BCPS, Salley Slaughter, CPP Hematology/Oncology Clinical Pharmacist Practitioner [facility] [phone]  06/22/2021 11:09  AM

## 2021-06-22 NOTE — Assessment & Plan Note (Addendum)
#  Recurrent stage IV ER positive HER2 negative breast cancer-CT scan June 06, 2021 -bilateral pleural effusions [R>L]; tiny subcentimeter up to 15 liver lesions [new compared to PET scan May 2022]; sclerotic bone lesions.  #Patient has been on Femara; however currently on ribociclib second month only [3 weeks on 1 week off].  EKG today-no evidence of any abnormalities noted.  Continue current therapy.  CBC ANC 1.2.;  Asymptomatic.  Would recommend repeating the CBC in 2 weeks.  #I discussed with patient and husband-chemotherapeutic option given the residual disease/pleural effusion; however at the same time patient has been on combination AI plus CDK inhibitor only for a month at this time.  Patient is currently tolerating treatment fairly well without any major side effects.  I think is reasonable to continue current therapy.  We will also discussed with [facility] onc[facility] Dr. Fredricka Bonine.  #Right more than left pleural effusion-left-sided Pleurx cath thoracoscopy [followed by Dr.Wahidi in [place].  Discussed regarding thoracentesis on the right side-patient reluctant; she will let us know if she is symptomatic enough to need a thoracentesis.  Discussed regarding use of Valium prior to thoracentesis.  Continue draining about 200 cc every 2 days on the left side.  #Right breast lower outer quadrant  1 cm mass-clinically cysts rather than tumor.  Hold off any imaging at this time given the need to focus on metastatic disease.  However if lesion becomes bigger would consider imaging/biopsy  #Complementary therapies: Patient -interested in vitamin C high-dose oral [IV-insurance issues]; also interested in ivermectin [recommended by Dr. Alveda Reasons per patient].  Discussed with patient that I would review the literature-as long as vitamin C does not interact with the treatment; I should not have any contraindication.  However, given recurrent knowledge I would not recommend ivermectin given the lack of proven  benefit in breast cancer therapy.  I would review the literature and discussed with the patient next visit.  # + Sclerotic bone lesions-metastatic disease; previously declined bisphosphonate therapy.  We will discuss at next visit.  # Prognosis: Patient has been understand this is incurable disease.  However I have not gotten to the details of median life expectancy as I am seeing the patient on the first time; and patient has started therapy only in the last few months.  Discussed that I will review with her and husband the median life expectancy at next visit; and also discussing with [facility].    # DISPOSITION: # follow up in 4 weeks-./Monday MD; labs- cbc/cmp; ca 27-29; CEA; Ca15-3; -Dr.B  # I reviewed the blood work- with the patient in detail; also reviewed the imaging independently [as summarized above]; and with the patient in detail.   # 40 minutes face-to-face with the patient discussing the above plan of care; more than 50% of time spent on prognosis/ natural history; counseling and coordination.

## 2021-06-22 NOTE — Progress Notes (Signed)
Patient here for initial visit with Dr. Jacinto Reap. She currently has a pleurex catheter in place which is draining approx. 100 ml a day. She has level 4 pain at the site of insertion that worsens at rest.

## 2021-06-22 NOTE — Progress Notes (Signed)
[place] OFFICE PROGRESS NOTE  Patient Care Team: Jamie Aus, MD as PCP - Wu (Internal Medicine) Jamie Wu, Jamie Gleason, MD ([facility] Surgery) Jamie General, MD ([facility]) Jamie Kitchens, MD as Consulting Physician (Family Medicine) Jamie Huger, [name], estrogen receptor positive [facility]) Staging form: Breast, AJCC 8th Edition - Clinical stage from 05/11/2021: Stage IV (rcTX, [name], pM1, [name], [name], HER2-) - Signed by Jamie Huger, MD on 05/11/2021 Stage prefix: Recurrence    Oncology History Overview Note  # 2008- Left breast cancer [s/p Lumpec]; [name]; [name]; s/p Lumpectomy [Jamie Wu], [name], tamoxifen x 5 years, 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     % of Cells Staining Intensity Score Interpretation  ER IHC 73 2+ POSITIVE  PR IHC 4 3+ LOW POSITIVE  HER2/neu IHC 28 2+ EQUIVOCAL    HER2/NEU FISH RESULTS WILL BE ISSUED LATER IN THIS REPORT.  Electronically signed by Jamie Cho MD on 05/04/2021 at  2:47 PM  Addendum    INTERPRETATION   HER2/Neu Gene  Copy Number Centromere 17 (CEP17) Gene Copy Number HER2/Neu to CEP17 Ratio Interpretation  HER2/Neu FISH  2.4 1.6 1.65 NOT AMPLIFIED     CT- SEP 2022-Numerous new hepatic lesions compatible with metastatic disease, [name], [name], [name], [name], [name], estrogen receptor positive [place]  05/11/2021 Initial Diagnosis   [place]arcin[place] of left breast in [place], [place] receptor positive [place]   05/11/2021 Cancer Staging   Staging form: Breast, AJCC 8th Edition - Clinical stage from 05/11/2021: Stage IV (rcTX, [name], pM1, [name], [name], HER2-) - Signed by Jamie Huger MD on 05/11/2021 Stage prefix: Recurrence    Results for [name], [name](MRN [id number] as of 06/22/2021 11:40  Ref. Range 04/26/2010 10:28 01/19/2021 12:07 03/30/2021 09:48 05/10/2021 10:36 05/17/2021 09:17 06/08/2021 10:03  CA 27.29 Latest Ref Range: 0.0 - 38.6 U/mL  345.9 (H) 434.0 (H) 345.8 (H) 332.3 (H) 351.0 (H)     INTERVAL HISTORY:  Jamie Wu 50 y.o.  female pleasant patient above history of + metastatic recurrent ER/PR positive breast  cancer is here for initial consultation.  Patient diagnosed with ER/PR positive HER2 negative breast cancer post pleural biopsy/therapeutic in August 2022.  Patient currently has a Pleurx catheter on the left side.  She was initially started on letrozole in end of August 2022.  Ribociclib has been added; she is currently second cycle-5 days now.  Tolerating therapy fairly well except for hot flashes mild joint pains.  Denies any significant shortness of breath or cough.  Denies any nausea vomiting headache.  No fever no chills.   Review of Systems  Constitutional:  Positive for malaise/fatigue and weight loss. Negative for chills, diaphoresis and fever.  HENT:  Negative for nosebleeds and sore throat.   Eyes:  Negative for double vision.  Respiratory:  Positive for cough and shortness of breath. Negative for hemoptysis, sputum production and wheezing.   Cardiovascular:  Negative for chest pain, palpitations, orthopnea and leg swelling.  Gastrointestinal:  Negative for abdominal pain, blood in stool, constipation, diarrhea, heartburn, melena, nausea and vomiting.  Genitourinary:  Negative for dysuria, frequency and urgency.  Musculoskeletal:  Positive for joint pain. Negative for back pain.  Skin: Negative.  Negative for itching and rash.  Neurological:  Negative for dizziness, tingling, focal weakness, weakness and headaches.  Endo/Heme/Allergies:  Does not bruise/bleed easily.  Psychiatric/Behavioral:  Negative for depression. The patient is not nervous/anxious and does not have insomnia.      PAST MEDICAL HISTORY :  Past Medical History:  Diagnosis Date   Breast cancer ([place]) 2008   left breast   Personal history of malignant neoplasm of breast 2008   L LUMPECTOMY   Personal history of radiation therapy 2008   LEFT lumpectomy 2008    PAST SURGICAL HISTORY :   Past Surgical History:  Procedure Laterality Date   BREAST BIOPSY Right June 25, 2012   MRI abnormality showing  fibrocystic changes with microcalcifications.   BREAST EXCISIONAL BIOPSY Left 2008   breast ca   BREAST LUMPECTOMY Left 2008   CHOLECYSTECTOMY  1998   COLONOSCOPY WITH PROPOFOL N/A 07/29/2020   Procedure: COLONOSCOPY WITH PROPOFOL;  Surgeon: Jamie Bellow, MD;  [name];  Service: Endoscopy;  [name];    FAMILY HISTORY :   Family History  Problem Relation Age of Onset   Colon cancer Paternal Uncle        lat3 45's    Breast cancer Neg Hx     SOCIAL HISTORY:   Social History   Tobacco Use   Smoking status: Former    Packs/day: 1.00    Years: 3.00    Pack years: 3.00    Types: Cigarettes    Quit date: 09/10/1993    Years since quitting: 27.8   Smokeless tobacco: Never  Substance Use Topics   Alcohol use: Yes    Alcohol/week: 0.0 standard drinks    Comment: occas-wine   Drug use: No    ALLERGIES:  is allergic to contrast media [iodinated diagnostic agents] and sulfa antibiotics.  MEDICATIONS:  Current Outpatient Medications  Medication Sig Dispense Refill   albuterol (VENTOLIN HFA) 108 (90 Base) MCG/ACT inhaler Inhale into the lungs every 4 (four) hours as needed.     ascorbic acid (VITAMIN C) 500 MG  tablet Take 1,000 mg by mouth 2 (two) times daily.     benzonatate (TESSALON) 100 MG capsule Take by mouth 3 (three) times daily as needed for cough.     Cholecalciferol 50 MCG (2000 UT) CAPS Take 1 capsule by mouth daily at 12 noon.     cyanocobalamin 1000 MCG tablet Take 1 tablet by mouth daily at 12 noon.     famotidine (PEPCID) 20 MG tablet Take 20 mg by mouth once.     letrozole (FEMARA) 2.5 MG tablet Take 1 tablet (2.5 mg total) by mouth daily. 90 tablet 1   MISC NATURAL PRODUCTS PO Take 1 capsule by mouth daily. Vitamin K2 MK-7 150 mcg     Multiple Vitamin (MULTI-VITAMIN) tablet Take 1 tablet by mouth daily. Liposomal multivitamin     pantoprazole (PROTONIX) 40 MG tablet Take 40 mg by mouth daily.     ribociclib succ (KISQALI, 600 MG  DOSE,) 200 MG Therapy Pack Take 3 tablets (600 mg total) by mouth daily. Take for 21 days on, 7 days off, repeat every 28 days. 63 tablet 2   No current facility-administered medications for this visit.    PHYSICAL EXAMINATION: ECOG PERFORMANCE STATUS: 1 - Symptomatic but completely ambulatory  BP 122/87   Pulse 85   Temp 97.8 F (36.6 C) (Tympanic)   Resp 16   Wt 147 lb (66.7 kg)   BMI 21.71 kg/m   Filed Weights   06/22/21 1121  Weight: 147 lb (66.7 kg)    Physical Exam Vitals and nursing note reviewed.  HENT:     Head: Normocephalic and atraumatic.     Mouth/Throat:     Pharynx: Oropharynx is clear.  Eyes:     Extraocular Movements: Extraocular movements intact.     Pupils: Pupils are equal, round, and reactive to light.  Cardiovascular:     Rate and Rhythm: Normal rate and regular rhythm.  Pulmonary:     Comments: Decreased breath sounds bilaterally.  Abdominal:     Palpations: Abdomen is soft.  Musculoskeletal:        Wu: Normal range of motion.     Cervical back: Normal range of motion.  Skin:    Wu: Skin is warm.  Neurological:     Wu: No focal deficit present.     Mental Status: She is alert and oriented to person, place, and time.  Psychiatric:        Behavior: Behavior normal.        Judgment: Judgment normal.       LABORATORY DATA:  I have reviewed the data as listed    Component Value Date/Time   NA 137 06/22/2021 1105   K 4.3 06/22/2021 1105   CL 106 06/22/2021 1105   CO2 25 06/22/2021 1105   GLUCOSE 89 06/22/2021 1105   BUN 21 (H) 06/22/2021 1105   CREATININE 0.90 06/22/2021 1105   CALCIUM 9.0 06/22/2021 1105   PROT 6.5 06/22/2021 1105   ALBUMIN 3.6 06/22/2021 1105   AST 26 06/22/2021 1105   ALT 20 06/22/2021 1105   ALKPHOS 84 06/22/2021 1105   BILITOT 1.0 06/22/2021 1105   GFRNONAA >60 06/22/2021 1105   GFRAA >60 05/02/2017 0936    No results found for: SPEP, UPEP  Lab Results  Component Value Date   WBC 2.5  (L) 06/22/2021   NEUTROABS 1.2 (L) 06/22/2021   HGB 12.6 06/22/2021   HCT 35.6 (L) 06/22/2021   MCV 85.4 06/22/2021   PLT 214 06/22/2021  Chemistry      Component Value Date/Time   NA 137 06/22/2021 1105   K 4.3 06/22/2021 1105   CL 106 06/22/2021 1105   CO2 25 06/22/2021 1105   BUN 21 (H) 06/22/2021 1105   CREATININE 0.90 06/22/2021 1105      Component Value Date/Time   CALCIUM 9.0 06/22/2021 1105   ALKPHOS 84 06/22/2021 1105   AST 26 06/22/2021 1105   ALT 20 06/22/2021 1105   BILITOT 1.0 06/22/2021 1105       RADIOGRAPHIC STUDIES: I have personally reviewed the radiological images as listed and agreed with the findings in the report. No results found.   ASSESSMENT & PLAN:  Carcinoma of overlapping sites of left breast in female, estrogen receptor positive ([place]) #Recurrent stage IV ER positive HER2 negative breast cancer-CT scan June 06, 2021 -bilateral pleural effusions [R>L]; tiny subcentimeter up to 15 liver lesions [new compared to PET scan May 2022]; sclerotic bone lesions.  #Patient has been on Femara; however currently on ribociclib second month only [3 weeks on 1 week off].  EKG today-no evidence of any abnormalities noted.  Continue current therapy.  CBC ANC 1.2.;  Asymptomatic.  Would recommend repeating the CBC in 2 weeks.  #I discussed with patient and husband-chemotherapeutic option given the residual disease/pleural effusion; however at the same time patient has been on combination AI plus CDK inhibitor only for a month at this time.  Patient is currently tolerating treatment fairly well without any major side effects.  I think is reasonable to continue current therapy.  We will also discussed with [facility] Dr. Fredricka Bonine.  #Right more than left pleural effusion-left-sided Pleurx cath thoracoscopy [followed by Dr.Wahidi in [place].  Discussed regarding thoracentesis on the right side-patient reluctant; she will let us know if she is symptomatic  enough to need a thoracentesis.  Discussed regarding use of Valium prior to thoracentesis.  Continue draining about 200 cc every 2 days on the left side.  #Right breast lower outer quadrant  1 cm mass-clinically cysts rather than tumor.  Hold off any imaging at this time given the need to focus on metastatic disease.  However if lesion becomes bigger would consider imaging/biopsy  #Complementary therapies: Patient -interested in vitamin C high-dose oral [IV-insurance issues]; also interested in ivermectin [recommended by Dr. Alveda Reasons per patient].  Discussed with patient that I would review the literature-as long as vitamin C does not interact with the treatment; I should not have any contraindication.  However, given recurrent knowledge I would not recommend ivermectin given the lack of proven benefit in breast cancer therapy.  I would review the literature and discussed with the patient next visit.  # + Sclerotic bone lesions-metastatic disease; previously declined bisphosphonate therapy.  We will discuss at next visit.  # Prognosis: Patient has been understand this is incurable disease.  However I have not gotten to the details of median life expectancy as I am seeing the patient on the first time; and patient has started therapy only in the last few months.  Discussed that I will review with her and husband the median life expectancy at next visit; and also discussing with [facility].    # DISPOSITION: # follow up in 4 weeks-./Monday MD; labs- cbc/cmp; ca 27-29; CEA; Ca15-3; -Dr.B  # I reviewed the blood work- with the patient in detail; also reviewed the imaging independently [as summarized above]; and with the patient in detail.   # 40 minutes face-to-face with the patient discussing  the above plan of care; more than 50% of time spent on prognosis/ natural history; counseling and coordination.   No orders of the defined types were placed in this encounter.  All questions were  answered. The patient knows to call the clinic with any problems, questions or concerns.      Cammie Sickle, MD 06/22/2021 6:42 PM

## 2021-06-23 ENCOUNTER — Telehealth: Payer: Self-pay | Admitting: *Deleted

## 2021-06-23 LAB — CANCER ANTIGEN 27.29: CA 27.29: 308.8 U/mL — ABNORMAL HIGH (ref 0.0–38.6)

## 2021-06-23 NOTE — Telephone Encounter (Signed)
Jamie Wu please inform patient that her white count is slightly low-from her cancer pills. I would recommend repeating CBC in 2 weeks; please order and schedule. Continue current dose of therapy. Thank you, GB

## 2021-06-23 NOTE — Telephone Encounter (Signed)
Colette- pls add lab encounter in 2 weeks.

## 2021-06-27 DIAGNOSIS — J90 Pleural effusion, not elsewhere classified: Secondary | ICD-10-CM | POA: Diagnosis not present

## 2021-06-27 DIAGNOSIS — C50919 Malignant neoplasm of unspecified site of unspecified female breast: Secondary | ICD-10-CM | POA: Diagnosis not present

## 2021-06-29 ENCOUNTER — Inpatient Hospital Stay (HOSPITAL_BASED_OUTPATIENT_CLINIC_OR_DEPARTMENT_OTHER): Payer: 59 | Admitting: Oncology

## 2021-06-29 ENCOUNTER — Other Ambulatory Visit: Payer: Self-pay

## 2021-06-29 ENCOUNTER — Inpatient Hospital Stay: Payer: 59

## 2021-06-29 ENCOUNTER — Encounter: Payer: Self-pay | Admitting: Oncology

## 2021-06-29 ENCOUNTER — Other Ambulatory Visit: Payer: Self-pay | Admitting: *Deleted

## 2021-06-29 VITALS — BP 135/79 | HR 91 | Temp 97.8°F | Resp 20 | Wt 148.9 lb

## 2021-06-29 DIAGNOSIS — J982 Interstitial emphysema: Secondary | ICD-10-CM | POA: Diagnosis not present

## 2021-06-29 DIAGNOSIS — Z170 Estrogen receptor positive status [ER+]: Secondary | ICD-10-CM | POA: Diagnosis not present

## 2021-06-29 DIAGNOSIS — R590 Localized enlarged lymph nodes: Secondary | ICD-10-CM | POA: Diagnosis not present

## 2021-06-29 DIAGNOSIS — J90 Pleural effusion, not elsewhere classified: Secondary | ICD-10-CM | POA: Diagnosis not present

## 2021-06-29 DIAGNOSIS — C7951 Secondary malignant neoplasm of bone: Secondary | ICD-10-CM | POA: Diagnosis not present

## 2021-06-29 DIAGNOSIS — C50919 Malignant neoplasm of unspecified site of unspecified female breast: Secondary | ICD-10-CM

## 2021-06-29 DIAGNOSIS — R599 Enlarged lymph nodes, unspecified: Secondary | ICD-10-CM

## 2021-06-29 DIAGNOSIS — C50811 Malignant neoplasm of overlapping sites of right female breast: Secondary | ICD-10-CM | POA: Diagnosis not present

## 2021-06-29 DIAGNOSIS — C782 Secondary malignant neoplasm of pleura: Secondary | ICD-10-CM | POA: Diagnosis not present

## 2021-06-29 DIAGNOSIS — Z87891 Personal history of nicotine dependence: Secondary | ICD-10-CM | POA: Diagnosis not present

## 2021-06-29 DIAGNOSIS — Z79899 Other long term (current) drug therapy: Secondary | ICD-10-CM | POA: Diagnosis not present

## 2021-06-29 LAB — CBC WITH DIFFERENTIAL/PLATELET
Abs Immature Granulocytes: 0.01 K/uL (ref 0.00–0.07)
Basophils Absolute: 0.1 K/uL (ref 0.0–0.1)
Basophils Relative: 2 %
Eosinophils Absolute: 0 K/uL (ref 0.0–0.5)
Eosinophils Relative: 1 %
HCT: 35.1 % — ABNORMAL LOW (ref 36.0–46.0)
Hemoglobin: 12.3 g/dL (ref 12.0–15.0)
Immature Granulocytes: 0 %
Lymphocytes Relative: 13 %
Lymphs Abs: 0.4 K/uL — ABNORMAL LOW (ref 0.7–4.0)
MCH: 30.7 pg (ref 26.0–34.0)
MCHC: 35 g/dL (ref 30.0–36.0)
MCV: 87.5 fL (ref 80.0–100.0)
Monocytes Absolute: 0.2 K/uL (ref 0.1–1.0)
Monocytes Relative: 5 %
Neutro Abs: 2.5 K/uL (ref 1.7–7.7)
Neutrophils Relative %: 79 %
Platelets: 366 K/uL (ref 150–400)
RBC: 4.01 MIL/uL (ref 3.87–5.11)
RDW: 15.9 % — ABNORMAL HIGH (ref 11.5–15.5)
Smear Review: NORMAL
WBC: 3.2 K/uL — ABNORMAL LOW (ref 4.0–10.5)
nRBC: 0 % (ref 0.0–0.2)

## 2021-06-29 LAB — COMPREHENSIVE METABOLIC PANEL
ALT: 20 U/L (ref 0–44)
AST: 23 U/L (ref 15–41)
Albumin: 3.6 g/dL (ref 3.5–5.0)
Alkaline Phosphatase: 88 U/L (ref 38–126)
Anion gap: 6 (ref 5–15)
BUN: 18 mg/dL (ref 6–20)
CO2: 26 mmol/L (ref 22–32)
Calcium: 8.8 mg/dL — ABNORMAL LOW (ref 8.9–10.3)
Chloride: 104 mmol/L (ref 98–111)
Creatinine, Ser: 0.85 mg/dL (ref 0.44–1.00)
GFR, Estimated: >60 mL/min (ref >60)
Glucose, Bld: 100 mg/dL — ABNORMAL HIGH (ref 70–99)
Potassium: 3.8 mmol/L (ref 3.5–5.1)
Sodium: 136 mmol/L (ref 135–145)
Total Bilirubin: 1.6 mg/dL — ABNORMAL HIGH (ref 0.3–1.2)
Total Protein: 6.6 g/dL (ref 6.5–8.1)

## 2021-06-29 MED ORDER — LEVOFLOXACIN 500 MG PO TABS: 500.0000 mg | ORAL_TABLET | Freq: Every day | ORAL | 0 refills | Status: DC

## 2021-06-29 NOTE — Progress Notes (Signed)
Symptom Management Consult note [facility]  [phone] [phone]  Patient Care Team: Jamie Aus, MD as PCP - Wu (Internal Medicine) Jamie Wu, [name], MD (Wu Surgery) Jamie General, MD ([facility]) Jamie Kitchens, MD as Consulting Physician (Family Medicine) Jamie Huger, MD as Consulting Physician (Hematology and Oncology)   Name of the patient: Jamie Wu  [id number]  [birth year]   Date of visit: 06/29/2021   Diagnosis- Breast Cancer   Chief complaint/ Reason for visit- Axillary lymph nodes enlargement  Heme/Onc history: Jamie Wu is a 50 year old female with multiple medical problems who is followed by Dr. Rogue Wu for recurrent metastatic ER/PR positive breast cancer.  She was initially treated with surgery and 4 years of tamoxifen back in 2013.  She did not have chemo.  She began to have worsening shortness of breath in November 2021 but was also diagnosed with COVID.  CT chest on 01/10/2021 showed pleural effusion with sclerotic spinal lesions.  PET scan on 01/31/2021 showed persistent patchy groundglass opacities with hypermetabolism at T7 with others in axial spine.  Parietal pleural biopsy on 05/01/2021 showed metastatic carcinoma.  She was started back on letrozole and on ribociclib.  Interval CT scan from September 2022 showed numerous new hepatic lesions.  She had a Pleurx catheter placed on the left side due to recurrent pleural effusion.  Interval history-Jamie Wu presents to symptom management today for concerns of bilateral axillary lymphadenopathy she noticed approximately 24 to 36 hours ago.  Reports using a new deodorant that has caused a rash underneath her arms but otherwise denies any new medications or injury.  Endorses some tenderness to both axillary areas.  Denies any swelling anywhere else.  Denies any other concerns.  ECOG FS:1 - Symptomatic but completely  ambulatory  Review of systems- Review of Systems  Endo/Heme/Allergies:        Bilateral axillary lymphadenopathy    Current treatment-Kisqali 3 tablets daily. Allergies  Allergen Reactions   Contrast Media [Iodinated Diagnostic Agents] Hives    Pt get hives, itching and sneezing.    Sulfa Antibiotics Hives     Past Medical History:  Diagnosis Date   Breast cancer ([place]) 2008   left breast   Personal history of malignant neoplasm of breast 2008   L LUMPECTOMY   Personal history of radiation therapy 2008   LEFT lumpectomy 2008     Past Surgical History:  Procedure Laterality Date   BREAST BIOPSY Right June 25, 2012   MRI abnormality showing fibrocystic changes with microcalcifications.   BREAST EXCISIONAL BIOPSY Left 2008   breast ca   BREAST LUMPECTOMY Left 2008   CHOLECYSTECTOMY  1998   COLONOSCOPY WITH PROPOFOL N/A 07/29/2020   Procedure: COLONOSCOPY WITH PROPOFOL;  [name], MD;  [name];  Service: Endoscopy;  [name];    Social History   Socioeconomic History   Marital status: Married    Spouse name: Not on file   Number of children: Not on file   Years of education: Not on file   Highest education level: Not on file  Occupational History   Not on file  Tobacco Use   Smoking status: Former    Packs/day: 1.00    Years: 3.00    Pack years: 3.00    Types: Cigarettes    Quit date: 09/10/1993    Years since quitting: 27.8   Smokeless tobacco: Never  Substance and Sexual Activity   Alcohol use:  Yes    Alcohol/week: 0.0 standard drinks    Comment: occas-wine   Drug use: No   Sexual activity: Not on file  Other Topics Concern   Not on file  Social History Narrative   Not on file   Social Determinants of Health   Financial Resource Strain: Not on file  Food Insecurity: Not on file  Transportation Needs: Not on file  Physical Activity: Not on file  Stress: Not on file  Social Connections: Not on file   Intimate Partner Violence: Not on file    Family History  Problem Relation Age of Onset   Colon cancer Paternal Uncle        lat3 6's    Breast cancer Neg Hx      Current Outpatient Medications:    ascorbic acid (VITAMIN C) 500 MG tablet, Take 1,000 mg by mouth 2 (two) times daily., Disp: , Rfl:    Cholecalciferol 50 MCG (2000 UT) CAPS, Take 1 capsule by mouth daily at 12 noon., Disp: , Rfl:    cyanocobalamin 1000 MCG tablet, Take 1 tablet by mouth daily at 12 noon., Disp: , Rfl:    letrozole (FEMARA) 2.5 MG tablet, Take 1 tablet (2.5 mg total) by mouth daily., Disp: 90 tablet, Rfl: 1   levofloxacin (LEVAQUIN) 500 MG tablet, Take 1 tablet (500 mg total) by mouth daily., Disp: 7 tablet, Rfl: 0   MISC NATURAL PRODUCTS PO, Take 1 capsule by mouth daily. Vitamin K2 MK-7 150 mcg, Disp: , Rfl:    Multiple Vitamin (MULTI-VITAMIN) tablet, Take 1 tablet by mouth daily. Liposomal multivitamin, Disp: , Rfl:    ribociclib succ (KISQALI, 600 MG DOSE,) 200 MG Therapy Pack, Take 3 tablets (600 mg total) by mouth daily. Take for 21 days on, 7 days off, repeat every 28 days., Disp: 63 tablet, Rfl: 2   albuterol (VENTOLIN HFA) 108 (90 Base) MCG/ACT inhaler, Inhale into the lungs every 4 (four) hours as needed. (Patient not taking: Reported on 06/29/2021), Disp: , Rfl:    benzonatate (TESSALON) 100 MG capsule, Take by mouth 3 (three) times daily as needed for cough. (Patient not taking: Reported on 06/29/2021), Disp: , Rfl:    famotidine (PEPCID) 20 MG tablet, Take 20 mg by mouth once. (Patient not taking: Reported on 06/29/2021), Disp: , Rfl:    pantoprazole (PROTONIX) 40 MG tablet, Take 40 mg by mouth daily. (Patient not taking: Reported on 06/29/2021), Disp: , Rfl:   Physical exam:  Vitals:   06/29/21 1112  BP: 135/79  Pulse: 91  Resp: 20  Temp: 97.8 F (36.6 C)  SpO2: 100%  Weight: 148 lb 14.4 oz (67.5 kg)   Physical Exam Constitutional:      Appearance: Normal appearance.  HENT:      Head: Normocephalic and atraumatic.  Eyes:     Pupils: Pupils are equal, round, and reactive to light.  Cardiovascular:     Rate and Rhythm: Normal rate and regular rhythm.     Heart sounds: Normal heart sounds. No murmur heard. Pulmonary:     Effort: Pulmonary effort is normal.     Breath sounds: Normal breath sounds. No wheezing.  Abdominal:     Wu: Bowel sounds are normal. There is no distension.     Palpations: Abdomen is soft.     Tenderness: There is no abdominal tenderness.  Musculoskeletal:        Wu: Normal range of motion.     Cervical back: Normal range of motion.  Lymphadenopathy:     Upper Body:     Right upper body: Axillary adenopathy present.     Left upper body: Axillary adenopathy present.  Skin:    Wu: Skin is warm and dry.     Findings: No rash.  Neurological:     Mental Status: She is alert and oriented to person, place, and time.  Psychiatric:        Judgment: Judgment normal.     CMP Latest Ref Rng & Units 06/29/2021  Glucose 70 - 99 mg/dL 100(H)  BUN 6 - 20 mg/dL 18  Creatinine 0.44 - 1.00 mg/dL 0.85  Sodium 135 - 145 mmol/L 136  Potassium 3.5 - 5.1 mmol/L 3.8  Chloride 98 - 111 mmol/L 104  CO2 22 - 32 mmol/L 26  Calcium 8.9 - 10.3 mg/dL 8.8(L)  Total Protein 6.5 - 8.1 g/dL 6.6  Total Bilirubin 0.3 - 1.2 mg/dL 1.6(H)  Alkaline Phos 38 - 126 U/L 88  AST 15 - 41 U/L 23  ALT 0 - 44 U/L 20   CBC Latest Ref Rng & Units 06/29/2021  WBC 4.0 - 10.5 K/uL 3.2(L)  Hemoglobin 12.0 - 15.0 g/dL 12.3  Hematocrit 36.0 - 46.0 % 35.1(L)  Platelets 150 - 400 K/uL 366    No images are attached to the encounter.  CT CHEST ABDOMEN PELVIS W CONTRAST  Result Date: 06/06/2021 CLINICAL DATA:  A 50 year old female with history of breast cancer, surveillance/follow-up evaluation, noted to have pleural fluid on previous imaging post PleurX catheter placement. EXAM: CT CHEST, ABDOMEN, AND PELVIS WITH CONTRAST TECHNIQUE: Multidetector CT imaging of the  chest, abdomen and pelvis was performed following the standard protocol during bolus administration of intravenous contrast. CONTRAST:  19mL OMNIPAQUE IOHEXOL 350 MG/ML SOLN COMPARISON:  Jan 10, 2021.  March 27, 2021. FINDINGS: CT CHEST FINDINGS Cardiovascular: Small pericardial effusion. Normal heart size. No nodularity of the pericardium grossly. Normal caliber of the thoracic aorta. Normal appearance of central pulmonary vessels. Mediastinum/Nodes: Post LEFT axillary dissection. No adenopathy at the thoracic inlet. No adenopathy in the mediastinum. Esophagus grossly normal. No hilar adenopathy. Lungs/Pleura: Interval placement of a LEFT-sided PleurX catheter since May of 2022 reportedly present based on outside imaging reports since at least August of 2022. No visible pneumothorax. Small amount of subcutaneous emphysema along the LEFT chest wall present by report on previous imaging, only small amount seen inferior to the catheter on the current study. RIGHT-sided pleural effusion has increased since May of 2022 now with large pleural effusion layering posteriorly in the RIGHT chest. Mild ground-glass and septal thickening in the RIGHT chest is markedly improved since previous imaging. Musculoskeletal: See below for full musculoskeletal detail. Mild stranding about LEFT-sided costochondral elements with indistinct fat planes anterior to the LEFT fifth costochondral junction shows no change. This is associated with generalized mild mediastinal stranding. CT ABDOMEN PELVIS FINDINGS Hepatobiliary: Hepatic metastatic disease is present, new lesion in the medial aspect of the RIGHT hemi liver (image 70/2) 15 mm. Numerous other foci of low and intermediate attenuation in both the LEFT and RIGHT hepatic lobes, for instance a LEFT hepatic lobe lesion on image 50/2 measuring 8 mm. At least 15 new lesions in the liver compared to more remote imaging. The portal vein is patent. Post cholecystectomy. No biliary duct  distension. RIGHT hepatic cyst as before. Pancreas: Normal, without mass, inflammation or ductal dilatation. Spleen: Spleen normal size and contour. Adrenals/Urinary Tract: Adrenal glands are normal. Symmetric renal enhancement. No hydronephrosis. Small cyst in  the interpolar LEFT kidney without change compared to imaging dating back to 2016. Urinary bladder with smooth contours without perivesical stranding. Stomach/Bowel: No acute gastrointestinal process. Appendix is normal. Vascular/Lymphatic: Aorta with smooth contours. IVC with smooth contours. No aneurysmal dilation of the abdominal aorta. There is no gastrohepatic or hepatoduodenal ligament lymphadenopathy. No retroperitoneal or mesenteric lymphadenopathy. No pelvic sidewall lymphadenopathy. Reproductive: Unremarkable by CT. Other: No ascites. Musculoskeletal: Scattered areas of sclerosis throughout the spine similar to prior PET imaging. Unchanged appearance of sclerosis in the LEFT proximal femur (image 125/2, this measures 11 mm. No new sclerotic lesions. No destructive bone process. IMPRESSION: Numerous new hepatic lesions compatible with metastatic disease, [name], MRI correlation could be helpful for further assessment as warranted. Interval placement of a LEFT-sided PleurX catheter since May of 2022, [name], small amount of subcutaneous emphysema inferior to the tube on the current study. Correlate with any tube manipulation or symptoms in this area. No visible pneumothorax. Large RIGHT-sided pleural effusion increased since May of 2022, not associated with overt nodularity. Improvement with respect to ground-glass and septal thickening in the RIGHT chest. Correlate with any ongoing respiratory symptoms. Scattered sclerotic lesions with similar appearance seen throughout the spine and in the  pelvis and LEFT proximal femur. Small pericardial effusion without nodularity. Electronically Signed   By: Zetta Bills M.D.   On: 06/06/2021 16:34     Assessment and plan- Patient is a 50 y.o. female who presents to symptom management for tender bilateral axillary lymphadenopathy.  Metastatic breast cancer-initially discovered and treated back in 2013 and treated.  Now with metastatic disease to spine and liver.  She has been stable on Kisquali since September 2022 along with letrozole.  She is scheduled to return to clinic on 07/17/2021 for further evaluation.   Bilateral axillary lymphadenopathy-she does endorse some URI symptoms with runny nose and postnasal drip.  Spoke with Dr. Rogue Wu who recommends antibiotics to see if lymphadenopathy improves.  Based on imaging, she did not appear to have any disease in affected location.  We also recommended a chest x-ray but patient would like to defer at this time given she has an appointment at [facility] early next week for follow-up for her Pleurx catheter.  States she always has chest x-rays done prior to that visit.  I encouraged her to let us know if symptoms worsen and that she does not need an appointment to have a chest x-ray.  A prescription sent in for Levaquin 500 mg daily x7 days.  Recurrent pleural effusion/left side-has Pleurx catheter placed and self drains every 2 days.  Removed 150 mL this morning.  Has follow-up next week at [facility].  Disposition-start antibiotic ASAP.  Chest x-ray if symptoms worsen.  RTC as scheduled.    Visit Diagnosis 1. Enlarged lymph nodes   2. Malignant neoplasm of female breast, unspecified estrogen receptor status, unspecified laterality, unspecified site of breast [facility])     Patient expressed understanding and was in agreement with this plan. She also understands that She can call clinic at any time with any questions, concerns, or complaints.   Greater than 50% was spent in counseling and coordination of care  with this patient including but not limited to discussion of the relevant topics above (See A&P) including, but not limited to diagnosis and management of acute and chronic medical conditions.  Thank you for allowing me to participate in the care of this very pleasant patient.    Jacquelin Hawking, NP [place] [facility] Cell - [phone] Pager- [phone] 06/30/2021 2:28 PM

## 2021-06-29 NOTE — Progress Notes (Signed)
Patient states her lymph nodes swollen. Patient states started a new deodorant on 10/14. Patient stopped using this one Tuesday due to the swollen lymph nodes. Patient states she has also being feeling like she is coming down with a cold.

## 2021-06-30 ENCOUNTER — Other Ambulatory Visit: Payer: Self-pay | Admitting: *Deleted

## 2021-06-30 DIAGNOSIS — C50919 Malignant neoplasm of unspecified site of unspecified female breast: Secondary | ICD-10-CM

## 2021-07-04 ENCOUNTER — Encounter: Payer: Self-pay | Admitting: Internal Medicine

## 2021-07-04 NOTE — Progress Notes (Signed)
I spoke to Dr. Fredricka Bonine- Plan is to continue current therapy. Will plan repeat imaging sooner than later. Dr. Fredricka Bonine will let me know if any complementary medicine referral available.  I spoke to patient/follow up she feels better since starting antibiotics. Recommend stopping Levaquin after five days./Tomorrow.Continue current therapy. Wants to hold a chest x-ray as she feels improved.  As per patient request OK to cancel blo odwork this week.   Miss Doren Custard cancel patient's blood work this week.  Thank you

## 2021-07-07 ENCOUNTER — Inpatient Hospital Stay: Payer: 59

## 2021-07-17 ENCOUNTER — Inpatient Hospital Stay: Payer: 59 | Attending: Internal Medicine | Admitting: Internal Medicine

## 2021-07-17 ENCOUNTER — Inpatient Hospital Stay: Payer: 59

## 2021-07-17 ENCOUNTER — Inpatient Hospital Stay: Payer: 59 | Admitting: Pharmacist

## 2021-07-17 ENCOUNTER — Other Ambulatory Visit: Payer: Self-pay

## 2021-07-17 VITALS — BP 133/93 | HR 79 | Temp 97.8°F | Resp 20 | Wt 148.0 lb

## 2021-07-17 DIAGNOSIS — Z8616 Personal history of COVID-19: Secondary | ICD-10-CM | POA: Diagnosis not present

## 2021-07-17 DIAGNOSIS — Z87891 Personal history of nicotine dependence: Secondary | ICD-10-CM | POA: Insufficient documentation

## 2021-07-17 DIAGNOSIS — Z79811 Long term (current) use of aromatase inhibitors: Secondary | ICD-10-CM | POA: Insufficient documentation

## 2021-07-17 DIAGNOSIS — C50812 Malignant neoplasm of overlapping sites of left female breast: Secondary | ICD-10-CM | POA: Diagnosis not present

## 2021-07-17 DIAGNOSIS — R0789 Other chest pain: Secondary | ICD-10-CM | POA: Diagnosis not present

## 2021-07-17 DIAGNOSIS — C7951 Secondary malignant neoplasm of bone: Secondary | ICD-10-CM | POA: Insufficient documentation

## 2021-07-17 DIAGNOSIS — Z800 Family history of malignant neoplasm of digestive organs: Secondary | ICD-10-CM | POA: Diagnosis not present

## 2021-07-17 DIAGNOSIS — Z170 Estrogen receptor positive status [ER+]: Secondary | ICD-10-CM | POA: Insufficient documentation

## 2021-07-17 DIAGNOSIS — C782 Secondary malignant neoplasm of pleura: Secondary | ICD-10-CM | POA: Diagnosis not present

## 2021-07-17 DIAGNOSIS — J90 Pleural effusion, not elsewhere classified: Secondary | ICD-10-CM | POA: Diagnosis not present

## 2021-07-17 DIAGNOSIS — C50919 Malignant neoplasm of unspecified site of unspecified female breast: Secondary | ICD-10-CM

## 2021-07-17 LAB — CBC WITH DIFFERENTIAL/PLATELET
Abs Immature Granulocytes: 0.01 10*3/uL (ref 0.00–0.07)
Basophils Absolute: 0.1 10*3/uL (ref 0.0–0.1)
Basophils Relative: 4 %
Eosinophils Absolute: 0.1 10*3/uL (ref 0.0–0.5)
Eosinophils Relative: 2 %
HCT: 35.3 % — ABNORMAL LOW (ref 36.0–46.0)
Hemoglobin: 12.8 g/dL (ref 12.0–15.0)
Immature Granulocytes: 0 %
Lymphocytes Relative: 28 %
Lymphs Abs: 0.7 10*3/uL (ref 0.7–4.0)
MCH: 32.2 pg (ref 26.0–34.0)
MCHC: 36.3 g/dL — ABNORMAL HIGH (ref 30.0–36.0)
MCV: 88.7 fL (ref 80.0–100.0)
Monocytes Absolute: 0.5 10*3/uL (ref 0.1–1.0)
Monocytes Relative: 18 %
Neutro Abs: 1.2 10*3/uL — ABNORMAL LOW (ref 1.7–7.7)
Neutrophils Relative %: 48 %
Platelets: 191 10*3/uL (ref 150–400)
RBC: 3.98 MIL/uL (ref 3.87–5.11)
RDW: 18.1 % — ABNORMAL HIGH (ref 11.5–15.5)
WBC: 2.6 10*3/uL — ABNORMAL LOW (ref 4.0–10.5)
nRBC: 0 % (ref 0.0–0.2)

## 2021-07-17 LAB — COMPREHENSIVE METABOLIC PANEL
ALT: 25 U/L (ref 0–44)
AST: 27 U/L (ref 15–41)
Albumin: 3.9 g/dL (ref 3.5–5.0)
Alkaline Phosphatase: 101 U/L (ref 38–126)
Anion gap: 7 (ref 5–15)
BUN: 16 mg/dL (ref 6–20)
CO2: 27 mmol/L (ref 22–32)
Calcium: 8.9 mg/dL (ref 8.9–10.3)
Chloride: 102 mmol/L (ref 98–111)
Creatinine, Ser: 0.77 mg/dL (ref 0.44–1.00)
GFR, Estimated: >60 mL/min (ref >60)
Glucose, Bld: 84 mg/dL (ref 70–99)
Potassium: 4 mmol/L (ref 3.5–5.1)
Sodium: 136 mmol/L (ref 135–145)
Total Bilirubin: 1.1 mg/dL (ref 0.3–1.2)
Total Protein: 6.7 g/dL (ref 6.5–8.1)

## 2021-07-17 MED ORDER — PREDNISONE 50 MG PO TABS: ORAL_TABLET | ORAL | 0 refills | Status: DC

## 2021-07-17 MED ORDER — MONTELUKAST SODIUM 10 MG PO TABS: ORAL_TABLET | ORAL | 1 refills | Status: DC

## 2021-07-17 MED ORDER — RIBOCICLIB SUCC (600 MG DOSE) 200 MG PO TBPK: 600.0000 mg | ORAL_TABLET | Freq: Every day | ORAL | 2 refills | Status: DC

## 2021-07-17 NOTE — Progress Notes (Signed)
Jamie Wu OFFICE PROGRESS NOTE  Patient Care Team: Jamie Aus, MD as PCP - Wu (Internal Medicine) Jamie Wu, Jamie Gleason, MD (Wu Surgery) Jamie General, MD (Wu Practice) Jamie Kitchens, MD as Consulting Physician (Family Medicine) Jamie Huger, [name], [name], AJCC 8th Edition - Clinical stage from 05/11/2021: Stage IV (rcTX, [name], pM1, [name], [name], HER2-) - Signed by Jamie Huger, MD on 05/11/2021 Stage prefix: Recurrence    Oncology History Overview Note  # 2008- Left breast cancer [s/p Lumpec]; [name]; [name]; [name], [name], tamoxifen x 5 years, 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     % of Cells Staining Intensity Score Interpretation  ER IHC 73 2+ POSITIVE  PR IHC 4 3+ LOW POSITIVE  HER2/neu IHC 28 2+ EQUIVOCAL    HER2/NEU FISH RESULTS WILL BE ISSUED LATER IN THIS REPORT.  Electronically signed by FElspeth Cho MD on 05/04/2021 at  2:47 PM  Addendum    INTERPRETATION   HER2/Neu Gene  Copy Number Centromere 17 (CEP17) Gene Copy Number HER2/Neu to CEP17 Ratio Interpretation  HER2/Neu FISH  2.4 1.6 1.65 NOT AMPLIFIED     CT- SEP 2022-Numerous new hepatic lesions compatible with metastatic disease, [name], [name], [name], [name], [name], estrogen receptor positive ([facility]  05/11/2021 Initial Diagnosis   Carcinoma of left breast in female, estrogen receptor positive ([facility]   05/11/2021 Cancer Staging   Staging form: Breast, AJCC 8th Edition - Clinical stage from 05/11/2021: Stage IV (rcTX, [name], pM1, [name], [name], HER2-) - Signed by FLloyd Huger MD on 05/11/2021 Stage prefix: Recurrence     Results for HSANYAH, Wu(MRN [id number] as of 06/22/2021 11:40  Ref. Range 04/26/2010 10:28 01/19/2021 12:07 03/30/2021 09:48 05/10/2021 10:36 05/17/2021 09:17 06/08/2021 10:03  CA 27.29 Latest Ref Range: 0.0 - 38.6 U/mL  345.9 (H) 434.0 (H) 345.8 (H) 332.3 (H) 351.0 (H)     INTERVAL HISTORY:  Jamie Wu 50 y.o.  female pleasant patient above history of + metastatic recurrent ER/PR positive breast  cancer is on letrozole plus ribociclib is here for follow-up.  In the interim patient was evaluated in the symptom management clinic for sore throat/URI swollen lymph nodes in the neck.  Patient received antibiotics.  Notes improvement of her lymphadenopathy.  Denies any dizzy spells.  No fevers or chills.  Denies any worsening shortness of breath or cough.  Patient interested in starting exercise program.  Review of Systems  Constitutional:  Positive for malaise/fatigue and weight loss. Negative for chills, [name], sputum production and wheezing.   Cardiovascular:  Negative for chest pain, palpitations, orthopnea and leg swelling.  Gastrointestinal:  Negative for abdominal pain, blood in stool, constipation, diarrhea, heartburn, melena, nausea and vomiting.  Genitourinary:  Negative for dysuria, frequency and urgency.  Musculoskeletal:  Positive for joint pain. Negative for back pain.  Skin: Negative.  Negative for itching and rash.  Neurological:  Negative for dizziness, tingling, focal weakness, weakness and headaches.  Endo/Heme/Allergies:  Does not bruise/bleed easily.  Psychiatric/Behavioral:  Negative for depression. The patient is not nervous/anxious and does not have insomnia.      PAST MEDICAL HISTORY :  Past Medical History:  Diagnosis Date   Breast cancer ([place]) 2008   left breast   Personal history of malignant neoplasm of breast 2008   L LUMPECTOMY   Personal history of radiation therapy 2008   LEFT lumpectomy 2008    PAST SURGICAL HISTORY :   Past Surgical History:  Procedure Laterality Date   BREAST BIOPSY Right June 25, 2012   MRI abnormality showing fibrocystic changes with microcalcifications.   BREAST EXCISIONAL BIOPSY Left 2008   breast ca   BREAST LUMPECTOMY Left 2008   CHOLECYSTECTOMY  1998   COLONOSCOPY WITH  PROPOFOL N/A 07/29/2020   Procedure: COLONOSCOPY WITH PROPOFOL;  [name], MD;  [name];  Service: Endoscopy;  [name];    FAMILY HISTORY :   Family History  Problem Relation Age of Onset   Colon cancer Paternal Uncle        lat3 7's    Breast cancer Neg Hx     SOCIAL HISTORY:   Social History   Tobacco Use   Smoking status: Former    Packs/day: 1.00    Years: 3.00    Pack years: 3.00    Types: Cigarettes    Quit date: 09/10/1993    Years since quitting: 27.8   Smokeless tobacco: Never  Substance Use Topics   Alcohol use: Yes    Alcohol/week: 0.0 standard drinks    Comment: occas-wine   Drug use: No    ALLERGIES:  is allergic to contrast media [iodinated diagnostic agents] and sulfa antibiotics.  MEDICATIONS:  Current Outpatient Medications  Medication Sig Dispense Refill   ascorbic acid (VITAMIN C) 500 MG tablet Take 1,000 mg by mouth 2 (two) times daily.     Cholecalciferol 50 MCG (2000 UT) CAPS Take 1 capsule by mouth daily at 12 noon.     cyanocobalamin 1000 MCG tablet Take 1 tablet by mouth daily at 12 noon.     letrozole [facility])  2.5 MG tablet Take 1 tablet (2.5 mg total) by mouth daily. 90 tablet 1   MISC NATURAL PRODUCTS PO Take 1 capsule by mouth daily. Vitamin K2 MK-7 150 mcg     montelukast (SINGULAIR) 10 MG tablet Take one pill 24 hours; and 2 hours prior to- IV dye/CT scan- 10 tablet 1   predniSONE (DELTASONE) 50 MG tablet Take 1 pill 50 mg orally 24 hours and 1-2 hours prior to scan. 2 tablet 0   albuterol (VENTOLIN HFA) 108 (90 Base) MCG/ACT inhaler Inhale into the lungs every 4 (four) hours as needed. (Patient not taking: No sig reported)     benzonatate (TESSALON) 100 MG capsule Take by mouth 3 (three) times daily as needed for cough. (Patient not taking: No sig reported)     famotidine (PEPCID) 20 MG tablet Take 20 mg by mouth once. (Patient not taking: No sig reported)     levofloxacin (LEVAQUIN) 500 MG tablet  Take 1 tablet (500 mg total) by mouth daily. (Patient not taking: Reported on 07/17/2021) 7 tablet 0   Multiple Vitamin (MULTI-VITAMIN) tablet Take 1 tablet by mouth daily. Liposomal multivitamin (Patient not taking: Reported on 07/17/2021)     pantoprazole (PROTONIX) 40 MG tablet Take 40 mg by mouth daily. (Patient not taking: No sig reported)     ribociclib succ (KISQALI, 600 MG DOSE,) 200 MG Therapy Pack Take 3 tablets (600 mg total) by mouth daily. Take for 21 days on, 7 days off, repeat every 28 days. 63 tablet 2   No current facility-administered medications for this visit.    PHYSICAL EXAMINATION: ECOG PERFORMANCE STATUS: 1 - Symptomatic but completely ambulatory  BP (!) 133/93   Pulse 79   Temp 97.8 F (36.6 C)   Resp 20   Wt 148 lb (67.1 kg)   SpO2 100%   BMI 21.86 kg/m   Filed Weights   07/17/21 1213  Weight: 148 lb (67.1 kg)    Physical Exam Vitals and nursing note reviewed.  HENT:     Head: Normocephalic and atraumatic.     Mouth/Throat:     Pharynx: Oropharynx is clear.  Eyes:     Extraocular Movements: Extraocular movements intact.     Pupils: Pupils are equal, round, and reactive to light.  Cardiovascular:     Rate and Rhythm: Normal rate and regular rhythm.  Pulmonary:     Comments: Decreased breath sounds bilaterally.  Abdominal:     Palpations: Abdomen is soft.  Musculoskeletal:        Wu: Normal range of motion.     Cervical back: Normal range of motion.  Skin:    Wu: Skin is warm.  Neurological:     Wu: No focal deficit present.     Mental Status: She is alert and oriented to person, place, and time.  Psychiatric:        Behavior: Behavior normal.        Judgment: Judgment normal.       LABORATORY DATA:  I have reviewed the data as listed    Component Value Date/Time   NA 136 07/17/2021 1059   K 4.0 07/17/2021 1059   CL 102 07/17/2021 1059   CO2 27 07/17/2021 1059   GLUCOSE 84 07/17/2021 1059   BUN 16 07/17/2021 1059    CREATININE 0.77 07/17/2021 1059   CALCIUM 8.9 07/17/2021 1059   PROT 6.7 07/17/2021 1059   ALBUMIN 3.9 07/17/2021 1059   AST 27 07/17/2021 1059   ALT 25  07/17/2021 1059   ALKPHOS 101 07/17/2021 1059   BILITOT 1.1 07/17/2021 1059   GFRNONAA >60 07/17/2021 1059   GFRAA >60 05/02/2017 0936    No results found for: SPEP, UPEP  Lab Results  Component Value Date   WBC 2.6 (L) 07/17/2021   NEUTROABS 1.2 (L) 07/17/2021   HGB 12.8 07/17/2021   HCT 35.3 (L) 07/17/2021   MCV 88.7 07/17/2021   PLT 191 07/17/2021      Chemistry      Component Value Date/Time   NA 136 07/17/2021 1059   K 4.0 07/17/2021 1059   CL 102 07/17/2021 1059   CO2 27 07/17/2021 1059   BUN 16 07/17/2021 1059   CREATININE 0.77 07/17/2021 1059      Component Value Date/Time   CALCIUM 8.9 07/17/2021 1059   ALKPHOS 101 07/17/2021 1059   AST 27 07/17/2021 1059   ALT 25 07/17/2021 1059   BILITOT 1.1 07/17/2021 1059       RADIOGRAPHIC STUDIES: I have personally reviewed the radiological images as listed and agreed with the findings in the report. No results found.   ASSESSMENT & PLAN:  Carcinoma of overlapping sites of left breast in female, estrogen receptor positive ([place]) #Recurrent stage IV ER positive HER2 negative breast cancer-CT scan June 06, 2021 -bilateral pleural effusions [R>L];SEP 2022-CT scan tiny subcentimeter up to 15 liver lesions [new compared to PET scan May 2022]; sclerotic bone lesions.  # Patient has been on Femara + on ribociclib -  [3 weeks on 1 week off].  EKG today-no evidence of any abnormalities noted.  Awaiting to start #3 cycle of ribociclib today [11/07].  Labs adequate.  ANC 1.2.  We will plan to get imaging after this cycle; CT scan chest and pelvis with contrast [Dye pre given].  Discussed patient's plan of care with Dr. Fredricka Bonine at [facility].   # Left chest wall discomfort [3-4/10]-likely secondary to underlying pleural effusion/scarring rather than any progressive disease.   Await above imaging.  #Right more than left pleural effusion-left-sided Pleurx cath thoracoscopy [followed by Dr.Wahidi in [place].  Still draining about 100 cc every day.  Awaiting reevaluation at [facility] in the next couple weeks.  #  Sclerotic bone lesions-metastatic disease; previously declined bisphosphonate therapy.  Await above imaging.  We will discuss at next visit.  # Prognosis: Patient has been understand this is incurable disease.  At this time patient/husband declined any discussion regarding life expectancy at this time.  # DISPOSITION: # follow up in 4 weeks-./Monday MD; labs- cbc/cmp; ca 27-29; CEA; Ca15-3; CT CAP -Dr.B    Orders Placed This Encounter  Procedures   CT CHEST ABDOMEN PELVIS W CONTRAST    Standing Status:   Future    Standing Expiration Date:   07/17/2022    Order Specific Question:   Preferred imaging location?    Answer:   [facility]    Order Specific Question:   Radiology Contrast Protocol - do NOT remove file path    Answer:   [email or web address]    Order Specific Question:   Is patient pregnant?    Answer:   No    All questions were answered. The patient knows to call the clinic with any problems, questions or concerns.      Cammie Sickle, MD 07/18/2021 8:25 AM

## 2021-07-17 NOTE — Assessment & Plan Note (Addendum)
#  Recurrent stage IV ER positive HER2 negative breast cancer-CT scan June 06, 2021 -bilateral pleural effusions [R>L];SEP 2022-CT scan tiny subcentimeter up to 15 liver lesions [new compared to PET scan May 2022]; sclerotic bone lesions.  # Patient has been on Femara + on ribociclib -  [3 weeks on 1 week off].  EKG today-no evidence of any abnormalities noted.  Awaiting to start #3 cycle of ribociclib today [11/07].  Labs adequate.  ANC 1.2.  We will plan to get imaging after this cycle; CT scan chest and pelvis with contrast [Dye pre given].  Discussed patient's plan of care with Dr. Fredricka Bonine at [facility].   # Left chest wall discomfort [3-4/10]-likely secondary to underlying pleural effusion/scarring rather than any progressive disease.  Await above imaging.  #Right more than left pleural effusion-left-sided Pleurx cath thoracoscopy [followed by Dr.Wahidi in [place].  Still draining about 100 cc every day.  Awaiting reevaluation at [facility] in the next couple weeks.  #  Sclerotic bone lesions-metastatic disease; previously declined bisphosphonate therapy.  Await above imaging.  We will discuss at next visit.  # Prognosis: Patient has been understand this is incurable disease.  At this time patient/husband declined any discussion regarding life expectancy at this time.  # DISPOSITION: # follow up in 4 weeks-./Monday MD; labs- cbc/cmp; ca 27-29; CEA; Ca15-3; CT CAP -Dr.B

## 2021-07-17 NOTE — Patient Instructions (Signed)
Take the following pre-medications prior to your scan:  # Prednisone 50 mg orally 24 hours and 1--2 hours prior to scan. [script sent]  # Singulair (Generic: montelukast) 10 mg orally 24 hours and 1-2 hours prior to scan. [script sent]  # Benadryl 1 pill 50 mg orally 1 hour prior to scan. [over the counter]  # Pepcid (Generic: famotidine) 20 mg orally 1 hour prior to scan. [over the counter]

## 2021-07-17 NOTE — Progress Notes (Signed)
[place]  [phone] [phone]  Patient Care Team: Rusty Aus, [name], [name], [name], [name], [name], MD as Consulting Physician (Hematology and Oncology)   Name of the patient: Jamie Wu  [id number]  [birth year]   Date of visit: 07/17/21  HPI: Patient is a 50 y.o. female with recurrent metastatic breast cancer. Currently treated with Kisqali (ribociclib) and letrozole. Started ribociclib on 05/22/21. She started her letrozole in 04/2021.  Reason for Consult: Oral chemotherapy follow-up for ribociclib therapy.   PAST MEDICAL HISTORY: Past Medical History:  Diagnosis Date   Breast cancer ([place]) 2008   left breast   Personal history of malignant neoplasm of breast 2008   L LUMPECTOMY   Personal history of radiation therapy 2008   LEFT lumpectomy 2008    HEMATOLOGY/ONCOLOGY HISTORY:  Oncology History Overview Note  # 2008- Left breast cancer [s/p Lumpec]; [name]; [name]; [name], [name], tamoxifen x 5 years, 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     % of Cells Staining Intensity Score Interpretation  ER IHC 73 2+ POSITIVE  PR IHC 4 3+ LOW POSITIVE  HER2/neu IHC 28 2+ EQUIVOCAL    HER2/NEU FISH RESULTS WILL BE ISSUED LATER IN THIS REPORT.  Electronically signed by FElspeth Cho MD on 05/04/2021 at  2:47 PM  Addendum    INTERPRETATION   HER2/Neu Gene Copy Number Centromere 17 (CEP17) Gene Copy Number HER2/Neu to CEP17 Ratio Interpretation  HER2/Neu FISH  2.4 1.6 1.65 NOT AMPLIFIED     CT- SEP 2022-Numerous new hepatic lesions compatible with metastatic disease, [name], [name], [name], [name], [name], estrogen receptor positive [place]  05/11/2021 Initial Diagnosis   Carcinoma of left breast in [place], estrogen receptor positive [place]  05/11/2021 Cancer Staging   Staging form: Breast, AJCC 8th Edition - Clinical stage from 05/11/2021: Stage IV (rcTX, [name], pM1, [name], [name], [name], MD on 05/11/2021 Stage prefix: Recurrence       ALLERGIES:  is allergic to contrast media [iodinated diagnostic agents] and sulfa antibiotics.  MEDICATIONS:  Current Outpatient Medications  Medication Sig Dispense Refill   albuterol (VENTOLIN HFA) 108 (90 Base) MCG/ACT inhaler Inhale into the lungs every 4 (four) hours as needed. (Patient not taking: Reported on 06/29/2021)     ascorbic acid (VITAMIN C) 500 MG tablet Take 1,000 mg by mouth 2 (two) times daily.     benzonatate (TESSALON) 100 MG capsule Take by mouth 3 (three) times daily as needed for cough. (Patient not taking: Reported on 06/29/2021)     Cholecalciferol 50 MCG (2000 UT) CAPS Take 1 capsule by mouth daily at 12 noon.     cyanocobalamin 1000 MCG tablet Take 1 tablet by mouth daily at 12 noon.     famotidine (PEPCID) 20 MG tablet Take 20 mg by mouth once. (Patient not taking: Reported on 06/29/2021)     letrozole (FEMARA) 2.5 MG tablet Take 1 tablet (2.5 mg total) by mouth daily. 90 tablet 1   levofloxacin (LEVAQUIN) 500 MG tablet Take 1 tablet (500 mg total) by mouth daily. 7 tablet 0   MISC NATURAL PRODUCTS PO Take 1 capsule by mouth daily. Vitamin K2 MK-7 150 mcg     Multiple Vitamin (MULTI-VITAMIN) tablet Take 1 tablet by mouth daily. Liposomal multivitamin     pantoprazole (PROTONIX) 40 MG tablet Take 40 mg by mouth daily. (Patient not taking: Reported on 06/29/2021)     ribociclib succ (KISQALI, 600 MG DOSE,) 200 MG Therapy Pack Take 3 tablets (600 mg total) by mouth daily. Take for 21 days on, 7 days off, repeat every 28 days. 63 tablet 2   No current facility-administered medications for this visit.    VITAL SIGNS: There were no vitals taken for this visit. There were no vitals filed for this visit.  Estimated body mass index is 21.99 kg/m as calculated from the following:   Height as of 01/19/21: 5' 9"  (1.753 m).   Weight as of 06/29/21: 67.5 kg (148 lb 14.4 oz).  LABS: CBC:    Component Value Date/Time   WBC 2.6 (L) 07/17/2021 1059   HGB 12.8  07/17/2021 1059   HCT 35.3 (L) 07/17/2021 1059   PLT 191 07/17/2021 1059   MCV 88.7 07/17/2021 1059   NEUTROABS 1.2 (L) 07/17/2021 1059   LYMPHSABS 0.7 07/17/2021 1059   MONOABS 0.5 07/17/2021 1059   EOSABS 0.1 07/17/2021 1059   BASOSABS 0.1 07/17/2021 1059   Comprehensive Metabolic Panel:    Component Value Date/Time   NA 136 06/29/2021 1103   K 3.8 06/29/2021 1103   CL 104 06/29/2021 1103   CO2 26 06/29/2021 1103   BUN 18 06/29/2021 1103   CREATININE 0.85 06/29/2021 1103   GLUCOSE 100 (H) 06/29/2021 1103   CALCIUM 8.8 (L) 06/29/2021 1103   AST 23 06/29/2021 1103   ALT 20 06/29/2021 1103   ALKPHOS 88 06/29/2021 1103   BILITOT 1.6 (H) 06/29/2021 1103   PROT 6.6 06/29/2021 1103   ALBUMIN 3.6 06/29/2021 1103     Present during today's visit: Patient only   Assessment and Plan: Reviewed CMP and CBC, neutrophils have decreased. Will continue to monitor.   Continue ribociclib 600 mg 21 days on/7 days off.  Today is C3D1.   Oral Chemotherapy Side Effect/Intolerance:  Constipation: Reports mild constipation during her on time with ribociclib, suggested that if this becomes bothersome to he that she try using Miralax, starting with 1/4 capful then increasing as needed Itching: has not occurred so far this cycle No reported fatigue (reports good energy), diarrhea, or nausea  Other: Recommendations from her holistic care provider discussed:  IV high dose vitamin C: patient mentioned she was considering this at her prior visit. Looked into the use of this for cancer patients. There is mixed data on the benefit, but I did not see harm mentioned in my search. In my opinion it would be up to the patient whether she proceed with this outside treatment Ivermectin: Continue to recommend against the use of ivermectin, as mentioned at her previous visit doTERRA products: Used the [facility] Natural Medicines Interaction Checker for the three doTERRA products Mrs. Goffredo was considering using, xEO  Mega, Vegan Microplex VMz, and Alpha CRS+. All products had interactions with ribociclib. Alpha CRS+ had the most and I would recommend against the use of this products. xEO Mega and Vegan Microplex VMz had less interactions overall, reviewed the interactions with patient and she will decide if she wants to proceed with either of this medications. She know that neither is 100% interaction free.   Oral Chemotherapy Adherence: No reported missed doses. No patient barriers to medication adherence identified.   New medications: None reported.  Medication Access Issues: [facility] at [facility] with no issues.  Patient expressed understanding and was in agreement with this plan. She also understands that She can call clinic at any time with any questions, concerns, or complaints.   Follow-up plan: RTC in 4 weeks  Thank you for allowing me to participate in the care of this very pleasant patient.   Time Total: 25 minutes  Visit consisted of counseling and education on dealing with issues of symptom management in the setting of serious and potentially life-threatening illness.Greater than 50%  of this time was spent counseling and coordinating care related to the above assessment and plan.  Signed by: Darl Pikes, PharmD, BCPS, Salley Slaughter, CPP Hematology/Oncology Clinical Pharmacist Practitioner [facility]/HP/AP [facility] [phone]  07/17/2021 11:24 AM

## 2021-07-18 ENCOUNTER — Encounter: Payer: Self-pay | Admitting: Internal Medicine

## 2021-07-18 LAB — CANCER ANTIGEN 27.29: CA 27.29: 234.4 U/mL — ABNORMAL HIGH (ref 0.0–38.6)

## 2021-07-18 LAB — CANCER ANTIGEN 15-3: CA 15-3: 215 U/mL — ABNORMAL HIGH (ref 0.0–25.0)

## 2021-07-18 LAB — CEA: CEA: 23.3 ng/mL — ABNORMAL HIGH (ref 0.0–4.7)

## 2021-07-18 NOTE — Addendum Note (Signed)
Addended by: Drue Dun on: 07/18/2021 08:44 AM   Modules accepted: Orders

## 2021-07-25 ENCOUNTER — Encounter: Payer: 59 | Attending: Internal Medicine

## 2021-07-25 ENCOUNTER — Other Ambulatory Visit: Payer: Self-pay

## 2021-07-25 VITALS — Ht 68.75 in | Wt 148.6 lb

## 2021-07-25 DIAGNOSIS — J90 Pleural effusion, not elsewhere classified: Secondary | ICD-10-CM | POA: Diagnosis not present

## 2021-07-25 DIAGNOSIS — R918 Other nonspecific abnormal finding of lung field: Secondary | ICD-10-CM | POA: Diagnosis not present

## 2021-07-25 DIAGNOSIS — J910 Malignant pleural effusion: Secondary | ICD-10-CM | POA: Diagnosis not present

## 2021-07-25 DIAGNOSIS — C50912 Malignant neoplasm of unspecified site of left female breast: Secondary | ICD-10-CM | POA: Diagnosis not present

## 2021-07-25 DIAGNOSIS — C801 Malignant (primary) neoplasm, unspecified: Secondary | ICD-10-CM | POA: Diagnosis not present

## 2021-07-25 DIAGNOSIS — C782 Secondary malignant neoplasm of pleura: Secondary | ICD-10-CM | POA: Diagnosis not present

## 2021-07-25 DIAGNOSIS — C50919 Malignant neoplasm of unspecified site of unspecified female breast: Secondary | ICD-10-CM

## 2021-07-25 NOTE — Progress Notes (Signed)
CARE Daily Session Note  Patient Details  Name: Jamie Wu MRN: [id number] Date of Birth: Jan 27, 1971 Referring Provider:   April Manson Cancer Associated Rehabilitation & Exercise from 07/25/2021 in [facility] Cardiac and Pulmonary Rehab  Referring Provider Charlaine Dalton MD       Encounter Date: 07/25/2021  Check In:  Session Check In - 07/25/21 1413       Check-In   Supervising physician immediately available to respond to emergencies See telemetry face sheet for immediately available ER MD    Location [facility]-Cardiac & Pulmonary Rehab    Staff Present Coralie Keens, MS, ASCM CEP, Exercise Physiologist;Kelly Rosalia Hammers, MPA, RN;Jessica Luan Pulling, MA, RCEP, CCRP, CCET    Virtual Visit No    Medication changes reported     No    Fall or balance concerns reported    No    Warm-up and Cool-down Not performed (comment)   6MWT and Gym Orientation   Resistance Training Performed No    VAD Patient? No    PAD/SET Patient? No              6 Minute Walk     Row Name 07/25/21 1418         6 Minute Walk   Phase Initial     Distance 1460 feet     Walk Time 6 minutes     # of Rest Breaks 0     MPH 2.76     METS 4.74     RPE 10     Perceived Dyspnea  0     VO2 Peak 16.6     Symptoms No     Resting HR 78 bpm     Resting BP 118/74     Resting Oxygen Saturation  97 %     Exercise Oxygen Saturation  during 6 min walk 93 %     Max Ex. HR 110 bpm     Max Ex. BP 140/76     2 Minute Post BP 130/76               Initial Exercise Prescription - 07/25/21 1400       Date of Initial Exercise RX and Referring Provider   Date 07/25/21    Referring Provider Charlaine Dalton MD      Treadmill   MPH 2.7    Grade 0    Minutes 15    METs 3.07      Recumbant Bike   Level 1    RPM 60    Minutes 15    METs 4.7      NuStep   Level 1    SPM 80    Minutes 15    METs 4.7      REL-XR   Level 1    Speed 50    Minutes 15    METs 4.7      Prescription Details    Frequency (times per week) 2    Duration Progress to 30 minutes of continuous aerobic without signs/symptoms of physical distress      Intensity   THRR 40-80% of Max Heartrate 114-150    Ratings of Perceived Exertion 11-13    Perceived Dyspnea 0-4      Progression   Progression Continue to progress workloads to maintain intensity without signs/symptoms of physical distress.      Resistance Training   Training Prescription Yes    Weight 3 lb    Reps 10-15  Exercise Prescription Changes - 07/25/21 1400       Response to Exercise   Blood Pressure (Admit) 118/74    Blood Pressure (Exercise) 140/76    Blood Pressure (Exit) 130/76    Heart Rate (Admit) 78 bpm    Heart Rate (Exercise) 110 bpm    Heart Rate (Exit) 79 bpm    Oxygen Saturation (Admit) 97 %    Oxygen Saturation (Exercise) 93 %    Oxygen Saturation (Exit) 97 %    Rating of Perceived Exertion (Exercise) 10    Perceived Dyspnea (Exercise) 0    Symptoms none    Comments walk test results             Social History   Tobacco Use  Smoking Status Former   Packs/day: 1.00   Years: 3.00   Pack years: 3.00   Types: Cigarettes   Quit date: 09/10/1993   Years since quitting: 27.8  Smokeless Tobacco Never    Goals Met:  Proper associated with RPD/PD & O2 Sat Exercise tolerated well Personal goals reviewed No report of concerns or symptoms today  Goals Unmet:  Not Applicable  Comments: First full day of orientation. All starting workloads were established based on the results of the 6 minute walk test done at initial orientation visit.  The plan for exercise progression was also introduced and progression will be customized based on patient's performance and goals.   Per [name], have patient start with light cardio, no heavy lifting due to pleurx catheter. Patient aware to stop if she feels any pulling or shifting with catheter. Patient aware we will start with very light loads despite  her having a higher baseline MET level. Patient did report she has had some chest pain (with movement, not exertion) and brought it up to [name] who stated it was scar tissue. Patient will inform staff if she feels anything during exercise and to discuss with provider if needed.  Dr. Emily Filbert is Medical Director for [place].  Dr. Ottie Glazier is Medical Director for [facility] Pulmonary Rehabilitation.

## 2021-07-27 ENCOUNTER — Other Ambulatory Visit: Payer: Self-pay

## 2021-07-27 DIAGNOSIS — C50919 Malignant neoplasm of unspecified site of unspecified female breast: Secondary | ICD-10-CM

## 2021-07-27 NOTE — Progress Notes (Signed)
Daily Session Note  Patient Details  Name: Jamie Wu MRN: [id number] Date of Birth: 07-07-1971 Referring Provider:   April Manson Cancer Associated Rehabilitation & Exercise from 07/25/2021 in [facility] Cardiac and Pulmonary Rehab  Referring Provider Charlaine Dalton MD       Encounter Date: 07/27/2021  Check In:  Session Check In - 07/27/21 1250       Check-In   Supervising physician immediately available to respond to emergencies See telemetry face sheet for immediately available ER MD    Location [facility]-Cardiac & Pulmonary Rehab    Staff Present Coralie Keens, MS, ASCM CEP, Exercise Physiologist;Amanda Oletta Darter, BA, ACSM CEP, Exercise Physiologist    Virtual Visit No    Medication changes reported     No    Fall or balance concerns reported    No    Warm-up and Cool-down Performed on first and last piece of equipment    Resistance Training Performed --   yoga   VAD Patient? No    PAD/SET Patient? No                Social History   Tobacco Use  Smoking Status Former   Packs/day: 1.00   Years: 3.00   Pack years: 3.00   Types: Cigarettes   Quit date: 09/10/1993   Years since quitting: 27.8  Smokeless Tobacco Never    Goals Met:  Proper associated with RPD/PD & O2 Sat Exercise tolerated well No report of concerns or symptoms today  Goals Unmet:  Not Applicable  Comments: First full day of exercise!  Patient was oriented to gym and equipment including functions, settings, policies, and procedures.  Patient's individual exercise prescription and treatment plan were reviewed.  All starting workloads were established based on the results of the 6 minute walk test done at initial orientation visit.  The plan for exercise progression was also introduced and progression will be customized based on patient's performance and goals.  Will continue to monitor for progression.   Yoga completed today by Exercise Physiologist, Nada Maclachlan.   Dr. Emily Filbert is Medical  Director for [place].  Dr. Ottie Glazier is Medical Director for [facility] Pulmonary Rehabilitation.

## 2021-07-28 ENCOUNTER — Ambulatory Visit: Payer: 59

## 2021-08-01 ENCOUNTER — Other Ambulatory Visit: Payer: Self-pay

## 2021-08-01 DIAGNOSIS — C50919 Malignant neoplasm of unspecified site of unspecified female breast: Secondary | ICD-10-CM

## 2021-08-01 NOTE — Progress Notes (Signed)
Daily Session Note  Patient Details  Name: Jamie Wu MRN: [id number] Date of Birth: [birth year] Referring Provider:   April Manson Cancer Associated Rehabilitation & Exercise from 07/25/2021 in [facility] Cardiac and Pulmonary Rehab  Referring Provider Charlaine Dalton MD       Encounter Date: 08/01/2021  Check In:  Session Check In - 08/01/21 1225       Check-In   Supervising physician immediately available to respond to emergencies See telemetry face sheet for immediately available ER MD    Location [facility]-Cardiac & Pulmonary Rehab    Staff Present Birdie Sons, MPA, RN;Amanda Sommer, BA, ACSM CEP, Exercise Physiologist;Kara Eliezer Bottom, MS, ASCM CEP, Exercise Physiologist    Virtual Visit No    Medication changes reported     No    Fall or balance concerns reported    No    Warm-up and Cool-down Performed on first and last piece of equipment    Resistance Training Performed Yes    VAD Patient? No    PAD/SET Patient? No      Pain Assessment   Currently in Pain? No/denies                Social History   Tobacco Use  Smoking Status Former   Packs/day: 1.00   Years: 3.00   Pack years: 3.00   Types: Cigarettes   Quit date: 09/10/1993   Years since quitting: 27.9  Smokeless Tobacco Never    Goals Met:  Independence with exercise equipment Exercise tolerated well No report of concerns or symptoms today Strength training completed today  Goals Unmet:  Not Applicable  Comments: First full day of exercise!  Patient was oriented to gym and equipment including functions, settings, policies, and procedures.  Patient's individual exercise prescription and treatment plan were reviewed.  All starting workloads were established based on the results of the 6 minute walk test done at initial orientation visit.  The plan for exercise progression was also introduced and progression will be customized based on patient's performance and goals.    Dr. Emily Filbert is Medical  Director for [place].  Dr. Ottie Glazier is Medical Director for [facility] Pulmonary Rehabilitation.

## 2021-08-08 ENCOUNTER — Other Ambulatory Visit: Payer: Self-pay

## 2021-08-08 DIAGNOSIS — C50919 Malignant neoplasm of unspecified site of unspecified female breast: Secondary | ICD-10-CM

## 2021-08-08 NOTE — Progress Notes (Signed)
Daily Session Note  Patient Details  Name: Jamie Wu MRN: [id number] Date of Birth: [birth year] Referring Provider:   April Manson Cancer Associated Rehabilitation & Exercise from 07/25/2021 in [facility] Cardiac and Pulmonary Rehab  Referring Provider Charlaine Dalton MD       Encounter Date: 08/08/2021  Check In:  Session Check In - 08/08/21 1227       Check-In   Supervising physician immediately available to respond to emergencies See telemetry face sheet for immediately available ER MD    Location [facility]-Cardiac & Pulmonary Rehab    Staff Present Birdie Sons, MPA, RN;Jessica Glen, MA, RCEP, CCRP, Marylynn Pearson, MS, ASCM CEP, Exercise Physiologist    Virtual Visit No    Medication changes reported     No    Fall or balance concerns reported    No    Warm-up and Cool-down Performed on first and last piece of equipment    Resistance Training Performed Yes    VAD Patient? No    PAD/SET Patient? No      Pain Assessment   Currently in Pain? No/denies                Social History   Tobacco Use  Smoking Status Former   Packs/day: 1.00   Years: 3.00   Pack years: 3.00   Types: Cigarettes   Quit date: 09/10/1993   Years since quitting: 27.9  Smokeless Tobacco Never    Goals Met:  Independence with exercise equipment Exercise tolerated well No report of concerns or symptoms today Strength training completed today  Goals Unmet:  Not Applicable  Comments: Pt able to follow exercise prescription today without complaint.  Will continue to monitor for progression.    Dr. Emily Filbert is Medical Director for [place].  Dr. Ottie Glazier is Medical Director for [facility] Pulmonary Rehabilitation.

## 2021-08-10 ENCOUNTER — Encounter: Payer: 59 | Attending: Internal Medicine

## 2021-08-10 ENCOUNTER — Other Ambulatory Visit: Payer: Self-pay

## 2021-08-10 ENCOUNTER — Encounter: Payer: Self-pay | Admitting: Internal Medicine

## 2021-08-10 ENCOUNTER — Telehealth: Payer: Self-pay | Admitting: *Deleted

## 2021-08-10 ENCOUNTER — Other Ambulatory Visit: Payer: Self-pay | Admitting: Internal Medicine

## 2021-08-10 ENCOUNTER — Other Ambulatory Visit: Payer: Self-pay | Admitting: General Surgery

## 2021-08-10 DIAGNOSIS — C50919 Malignant neoplasm of unspecified site of unspecified female breast: Secondary | ICD-10-CM | POA: Insufficient documentation

## 2021-08-10 DIAGNOSIS — Z91041 Radiographic dye allergy status: Secondary | ICD-10-CM

## 2021-08-10 MED ORDER — PREDNISONE 50 MG PO TABS: ORAL_TABLET | ORAL | 0 refills | Status: DC

## 2021-08-10 NOTE — Telephone Encounter (Signed)
Patient called stating that CT told her that she needs 1 more Prednisone 50 mg tablet as a pre medicine for her CT tomorrow since she has an allergy to contrast. Please send prescription ASAP

## 2021-08-10 NOTE — Telephone Encounter (Signed)
Your patient 

## 2021-08-10 NOTE — Telephone Encounter (Signed)
Can you look into this?

## 2021-08-10 NOTE — Progress Notes (Signed)
Daily Session Note  Patient Details  Name: Jamie Wu MRN: [id number] Date of Birth: 03-12-1971 Referring Provider:   April Manson Cancer Associated Rehabilitation & Exercise from 07/25/2021 in [facility] Cardiac and Pulmonary Rehab  Referring Provider Charlaine Dalton MD       Encounter Date: 08/10/2021  Check In:  Session Check In - 08/10/21 1219       Check-In   Supervising physician immediately available to respond to emergencies See telemetry face sheet for immediately available ER MD    Location [facility]-Cardiac & Pulmonary Rehab    Staff Present Birdie Sons, MPA, Nino Glow, MS, ASCM CEP, Exercise Physiologist;Amanda Oletta Darter, BA, ACSM CEP, Exercise Physiologist    Virtual Visit No    Medication changes reported     No    Fall or balance concerns reported    No    Warm-up and Cool-down Performed on first and last piece of equipment    Resistance Training Performed Yes    VAD Patient? No    PAD/SET Patient? No      Pain Assessment   Currently in Pain? No/denies                Social History   Tobacco Use  Smoking Status Former   Packs/day: 1.00   Years: 3.00   Pack years: 3.00   Types: Cigarettes   Quit date: 09/10/1993   Years since quitting: 27.9  Smokeless Tobacco Never    Goals Met:  Independence with exercise equipment Exercise tolerated well No report of concerns or symptoms today Strength training completed today  Goals Unmet:  Not Applicable  Comments: Pt able to follow exercise prescription today without complaint.  Will continue to monitor for progression.    Dr. Emily Filbert is Medical Director for [place].  Dr. Ottie Glazier is Medical Director for [facility] Pulmonary Rehabilitation.

## 2021-08-11 ENCOUNTER — Ambulatory Visit
Admission: RE | Admit: 2021-08-11 | Discharge: 2021-08-11 | Disposition: A | Discharge status: Home / Self Care | Payer: 59 | Source: Ambulatory Visit | Attending: Internal Medicine | Admitting: Internal Medicine

## 2021-08-11 ENCOUNTER — Ambulatory Visit: Admission: RE | Admit: 2021-08-11 | Payer: 59 | Source: Ambulatory Visit

## 2021-08-11 ENCOUNTER — Other Ambulatory Visit: Payer: Self-pay

## 2021-08-11 DIAGNOSIS — K7689 Other specified diseases of liver: Secondary | ICD-10-CM | POA: Diagnosis not present

## 2021-08-11 DIAGNOSIS — C50812 Malignant neoplasm of overlapping sites of left female breast: Secondary | ICD-10-CM | POA: Diagnosis not present

## 2021-08-11 DIAGNOSIS — C50919 Malignant neoplasm of unspecified site of unspecified female breast: Secondary | ICD-10-CM | POA: Diagnosis not present

## 2021-08-11 DIAGNOSIS — Z170 Estrogen receptor positive status [ER+]: Secondary | ICD-10-CM | POA: Insufficient documentation

## 2021-08-11 DIAGNOSIS — J90 Pleural effusion, not elsewhere classified: Secondary | ICD-10-CM | POA: Diagnosis not present

## 2021-08-11 MED ORDER — IOHEXOL 300 MG/ML  SOLN
100.0000 mL | Freq: Once | INTRAMUSCULAR | Status: AC | PRN
  Administered 2021-08-11: 100 mL via INTRAVENOUS

## 2021-08-15 ENCOUNTER — Inpatient Hospital Stay: Payer: 59 | Admitting: Pharmacist

## 2021-08-15 ENCOUNTER — Encounter: Payer: Self-pay | Admitting: Internal Medicine

## 2021-08-15 ENCOUNTER — Inpatient Hospital Stay (HOSPITAL_BASED_OUTPATIENT_CLINIC_OR_DEPARTMENT_OTHER): Payer: 59 | Admitting: Internal Medicine

## 2021-08-15 ENCOUNTER — Other Ambulatory Visit: Payer: Self-pay

## 2021-08-15 ENCOUNTER — Encounter: Payer: 59 | Admitting: *Deleted

## 2021-08-15 ENCOUNTER — Inpatient Hospital Stay: Payer: 59 | Attending: Internal Medicine

## 2021-08-15 DIAGNOSIS — Z79811 Long term (current) use of aromatase inhibitors: Secondary | ICD-10-CM | POA: Insufficient documentation

## 2021-08-15 DIAGNOSIS — Z923 Personal history of irradiation: Secondary | ICD-10-CM | POA: Insufficient documentation

## 2021-08-15 DIAGNOSIS — Z87891 Personal history of nicotine dependence: Secondary | ICD-10-CM | POA: Diagnosis not present

## 2021-08-15 DIAGNOSIS — J90 Pleural effusion, not elsewhere classified: Secondary | ICD-10-CM | POA: Diagnosis not present

## 2021-08-15 DIAGNOSIS — C7951 Secondary malignant neoplasm of bone: Secondary | ICD-10-CM | POA: Insufficient documentation

## 2021-08-15 DIAGNOSIS — N281 Cyst of kidney, acquired: Secondary | ICD-10-CM | POA: Insufficient documentation

## 2021-08-15 DIAGNOSIS — C50812 Malignant neoplasm of overlapping sites of left female breast: Secondary | ICD-10-CM | POA: Insufficient documentation

## 2021-08-15 DIAGNOSIS — C50919 Malignant neoplasm of unspecified site of unspecified female breast: Secondary | ICD-10-CM

## 2021-08-15 DIAGNOSIS — C782 Secondary malignant neoplasm of pleura: Secondary | ICD-10-CM | POA: Insufficient documentation

## 2021-08-15 DIAGNOSIS — K449 Diaphragmatic hernia without obstruction or gangrene: Secondary | ICD-10-CM | POA: Diagnosis not present

## 2021-08-15 DIAGNOSIS — Z170 Estrogen receptor positive status [ER+]: Secondary | ICD-10-CM | POA: Diagnosis not present

## 2021-08-15 DIAGNOSIS — I3139 Other pericardial effusion (noninflammatory): Secondary | ICD-10-CM | POA: Insufficient documentation

## 2021-08-15 LAB — CBC WITH DIFFERENTIAL/PLATELET
Abs Immature Granulocytes: 0.02 10*3/uL (ref 0.00–0.07)
Basophils Absolute: 0.1 10*3/uL (ref 0.0–0.1)
Basophils Relative: 3 %
Eosinophils Absolute: 0.1 10*3/uL (ref 0.0–0.5)
Eosinophils Relative: 3 %
HCT: 34.7 % — ABNORMAL LOW (ref 36.0–46.0)
Hemoglobin: 12.6 g/dL (ref 12.0–15.0)
Immature Granulocytes: 1 %
Lymphocytes Relative: 24 %
Lymphs Abs: 0.7 10*3/uL (ref 0.7–4.0)
MCH: 33.4 pg (ref 26.0–34.0)
MCHC: 36.3 g/dL — ABNORMAL HIGH (ref 30.0–36.0)
MCV: 92 fL (ref 80.0–100.0)
Monocytes Absolute: 0.5 10*3/uL (ref 0.1–1.0)
Monocytes Relative: 18 %
Neutro Abs: 1.6 10*3/uL — ABNORMAL LOW (ref 1.7–7.7)
Neutrophils Relative %: 51 %
Platelets: 241 10*3/uL (ref 150–400)
RBC: 3.77 MIL/uL — ABNORMAL LOW (ref 3.87–5.11)
RDW: 15.8 % — ABNORMAL HIGH (ref 11.5–15.5)
WBC: 3 10*3/uL — ABNORMAL LOW (ref 4.0–10.5)
nRBC: 0 % (ref 0.0–0.2)

## 2021-08-15 LAB — COMPREHENSIVE METABOLIC PANEL
ALT: 22 U/L (ref 0–44)
AST: 27 U/L (ref 15–41)
Albumin: 3.8 g/dL (ref 3.5–5.0)
Alkaline Phosphatase: 99 U/L (ref 38–126)
Anion gap: 10 (ref 5–15)
BUN: 20 mg/dL (ref 6–20)
CO2: 27 mmol/L (ref 22–32)
Calcium: 9.3 mg/dL (ref 8.9–10.3)
Chloride: 101 mmol/L (ref 98–111)
Creatinine, Ser: 0.9 mg/dL (ref 0.44–1.00)
GFR, Estimated: >60 mL/min (ref >60)
Glucose, Bld: 86 mg/dL (ref 70–99)
Potassium: 4.4 mmol/L (ref 3.5–5.1)
Sodium: 138 mmol/L (ref 135–145)
Total Bilirubin: 1.1 mg/dL (ref 0.3–1.2)
Total Protein: 6.8 g/dL (ref 6.5–8.1)

## 2021-08-15 NOTE — Progress Notes (Signed)
Daily Session Note  Patient Details  Name: Jamie Wu MRN: [id number] Date of Birth: 02/13/1971 Referring Provider:   April Manson Cancer Associated Rehabilitation & Exercise from 07/25/2021 in [facility] Cardiac and Pulmonary Rehab  Referring Provider Charlaine Dalton MD       Encounter Date: 08/15/2021  Check In:  Session Check In - 08/15/21 1254       Check-In   Supervising physician immediately available to respond to emergencies See telemetry face sheet for immediately available ER MD    Location [facility]-Cardiac & Pulmonary Rehab    Staff Present Alberteen Sam, MA, RCEP, CCRP, Marylynn Pearson, MS, ASCM CEP, Exercise Physiologist    Virtual Visit No    Medication changes reported     No    Fall or balance concerns reported    No    Warm-up and Cool-down Performed on first and last piece of equipment    Resistance Training Performed Yes    VAD Patient? No    PAD/SET Patient? No      Pain Assessment   Currently in Pain? No/denies                Social History   Tobacco Use  Smoking Status Former   Packs/day: 1.00   Years: 3.00   Pack years: 3.00   Types: Cigarettes   Quit date: 09/10/1993   Years since quitting: 27.9  Smokeless Tobacco Never    Goals Met:  Proper associated with RPD/PD & O2 Sat Independence with exercise equipment Exercise tolerated well No report of concerns or symptoms today Strength training completed today  Goals Unmet:  Not Applicable  Comments: Pt able to follow exercise prescription today without complaint.  Will continue to monitor for progression.    Dr. Emily Filbert is Medical Director for [place].  Dr. Ottie Glazier is Medical Director for [facility] Pulmonary Rehabilitation.

## 2021-08-15 NOTE — Progress Notes (Signed)
Patient still has occasional left side chest/breast pain.  Would like to discuss CT results from Friday.  Did have constipation for 1 week after her last 21 day cycle of Ribociclib.

## 2021-08-15 NOTE — Assessment & Plan Note (Addendum)
#  Recurrent stage IV ER positive HER2 negative breast cancer-currently on Femara plus on ribociclib - [3 weeks on 1 week off] DEC 4th, 2022- CT CAP-shows improvement of liver lesions; stable small sclerotic bone lesions; improvement of the interstitial pattern of the right lung opacities; small left-sided pleural effusion stable large to moderate-sized right-sided pleural effusion.  Currently s/p 3 cycles of Ribocilib +Femara.  #Proceed with cycle #4 of Ribociclib + Femara-tolerating well.  # Left chest wall discomfort [3-4/10]-likely secondary to underlying pleural effusion/scarring rather than any progressive disease; will discuss with [facility] for any options.  #Right more than left pleural effusion-left-sided Pleurx cath thoracoscopy [followed by Dr.Wahidi in [place].  Still draining about 75cc OD.  Awaiting reevaluation at [facility] in the next couple weeks.  #  Sclerotic bone lesions-metastatic disease; Declined bisphosphonate therapy- continues to monitor for repeat imnaging at this time.   # Incidental findings on CT Imaging dated: 11/28, 2022: Small hiatal hernia; kidney cyst; small pericardial effusion I reviewed/discussed/counseled the patient.   # DISPOSITION: # follow up in 4 weeks-./Monday MD; labs- cbc/cmp; ca 27-29; CEA; Ca15-3;-Dr.B

## 2021-08-15 NOTE — Progress Notes (Signed)
[facility] OFFICE PROGRESS NOTE  Patient Care Team: Rusty Aus, [name], [name], [name], [name], MD (Inactive) as Consulting Physician (Family Medicine) Jamie Huger, MD as Consulting Physician (Hematology and Oncology)   Cancer Staging  Carcinoma of overlapping sites of left breast in female, estrogen receptor positive [facility]) Staging form: Breast, AJCC 8th Edition - Clinical stage from 05/11/2021: Stage IV (rcTX, [name], pM1, [name], [name], HER2-) - Signed by Jamie Huger, MD on 05/11/2021 Stage prefix: Recurrence    Oncology History Overview Note  # 2008- Left breast cancer [s/p Lumpec]; [name]; [name]; [name], [name], tamoxifen x 5 years, 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     % of Cells Staining Intensity Score Interpretation  ER IHC 73 2+ POSITIVE  PR IHC 4 3+ LOW POSITIVE  HER2/neu IHC 28 2+ EQUIVOCAL    HER2/NEU FISH RESULTS WILL BE ISSUED LATER IN THIS REPORT.  Electronically signed by FElspeth Cho MD on 05/04/2021 at  2:47 PM  Addendum    INTERPRETATION    HER2/Neu Gene Copy Number Centromere 17 (CEP17) Gene Copy Number HER2/Neu to CEP17 Ratio Interpretation  HER2/Neu FISH  2.4 1.6 1.65 NOT AMPLIFIED     CT- SEP 2022-Numerous new hepatic lesions compatible with metastatic disease, [name], [name], [name], [name], [name], estrogen receptor positive ([facility]  05/11/2021 Initial Diagnosis   Carcinoma of left breast in female, estrogen receptor positive ([facility] [place]   05/11/2021 Cancer Staging   Staging form: Breast, AJCC 8th Edition - Clinical stage from 05/11/2021: Stage IV (rcTX, [name], pM1, [name], [name], HER2-) - Signed by FLloyd Huger MD on 05/11/2021 Stage prefix: Recurrence      INTERVAL HISTORY: Ambulating independently.  Accompanied by husband.  ADaisy [id number].  female pleasant patient above history of + metastatic  recurrent ER/PR positive breast cancer is on letrozole plus ribociclib is here for follow-up/review results of the CT scan.  Patient continues to complain of left anterior chest wall discomfort especially on deep breaths.  Otherwise denies any  worsening shortness of breath or cough.  No fever no chills.  Mild fatigue towards the end of 3 weeks of ribociclib.  Denies any dizzy spells.  No fevers or chills.  Denies any worsening shortness of breath or cough.    Review of Systems  Constitutional:  Positive for malaise/fatigue and weight loss. Negative for chills, [name], sputum production and wheezing.   Cardiovascular:  Negative for chest pain, palpitations, orthopnea and leg swelling.  Gastrointestinal:  Negative for abdominal pain, blood in stool, constipation, diarrhea, heartburn, melena, nausea and vomiting.  Genitourinary:  Negative for dysuria, frequency and urgency.  Musculoskeletal:  Positive for joint pain. Negative for back pain.  Skin: Negative.  Negative for itching and rash.  Neurological:  Negative for dizziness, tingling, focal weakness, weakness and headaches.  Endo/Heme/Allergies:  Does not bruise/bleed easily.  Psychiatric/Behavioral:  Negative for depression. The patient is not nervous/anxious and does not have insomnia.      PAST MEDICAL HISTORY :  Past Medical History:  Diagnosis Date   Breast cancer ([place]) 2008   left breast   Personal history of malignant neoplasm of breast 2008   L LUMPECTOMY   Personal history of radiation therapy 2008   LEFT lumpectomy 2008    PAST SURGICAL HISTORY :   Past Surgical History:  Procedure Laterality Date   BREAST BIOPSY Right June 25, 2012   MRI abnormality showing fibrocystic changes with microcalcifications.   BREAST EXCISIONAL BIOPSY Left 2008   breast ca   BREAST LUMPECTOMY Left 2008   CHOLECYSTECTOMY  1998   COLONOSCOPY WITH PROPOFOL N/A 07/29/2020   Procedure: COLONOSCOPY WITH PROPOFOL;  [name], MD;  [name];  Service: Endoscopy;  [name];    FAMILY HISTORY :    Family History  Problem Relation Age of Onset   Colon cancer Paternal Uncle        lat3 73's    Breast cancer Neg Hx     SOCIAL HISTORY:   Social History   Tobacco Use   Smoking status: Former    Packs/day: 1.00    Years: 3.00    Pack years: 3.00    Types: Cigarettes    Quit date: 09/10/1993    Years since quitting: 27.9   Smokeless tobacco: Never  Substance Use Topics   Alcohol use: Yes    Alcohol/week: 0.0 standard drinks    Comment: occas-wine   Drug use: No    ALLERGIES:  is allergic to contrast media [iodinated diagnostic agents] and sulfa antibiotics.  MEDICATIONS:  Current Outpatient Medications  Medication Sig Dispense Refill   letrozole (FEMARA) 2.5 MG tablet Take 1 tablet (2.5 mg total) by mouth daily. 90 tablet 1   ribociclib succ (KISQALI, 600 MG DOSE,) 200 MG Therapy Pack Take 3 tablets (600 mg total) by mouth daily. Take for 21 days on, 7 days off, repeat every 28 days. 63 tablet 2   albuterol (VENTOLIN HFA) 108 (90 Base) MCG/ACT inhaler Inhale into the lungs every 4 (four) hours as needed. (Patient not taking: Reported on 06/29/2021)     ascorbic acid (VITAMIN C) 500 MG  tablet Take 1,000 mg by mouth 2 (two) times daily. (Patient not taking: Reported on 07/25/2021)     benzonatate (TESSALON) 100 MG capsule Take by mouth 3 (three) times daily as needed for cough. (Patient not taking: Reported on 06/29/2021)     Cholecalciferol 50 MCG (2000 UT) CAPS Take 1 capsule by mouth daily at 12 noon. (Patient not taking: Reported on 07/25/2021)     cyanocobalamin 1000 MCG tablet Take 1 tablet by mouth daily at 12 noon. (Patient not taking: Reported on 07/25/2021)     famotidine (PEPCID) 20 MG tablet Take 20 mg by mouth once. (Patient not taking: Reported on 06/29/2021)     levofloxacin (LEVAQUIN) 500 MG tablet Take 1 tablet (500 mg total) by mouth daily. (Patient not taking: Reported on 07/17/2021) 7 tablet 0   MISC NATURAL PRODUCTS PO Take 1 capsule by mouth daily. Vitamin  K2 MK-7 150 mcg (Patient not taking: Reported on 07/25/2021)     montelukast (SINGULAIR) 10 MG tablet Take one pill 24 hours; and 2 hours prior to- IV dye/CT scan- (Patient not taking: Reported on 07/25/2021) 10 tablet 1   Multiple Vitamin (MULTI-VITAMIN) tablet Take 1 tablet by mouth daily. Liposomal multivitamin (Patient not taking: Reported on 07/17/2021)     pantoprazole (PROTONIX) 40 MG tablet Take 40 mg by mouth daily. (Patient not taking: Reported on 06/29/2021)     predniSONE (DELTASONE) 50 MG tablet Take 1 tablet- 17 hours; 7 hours; and 1 hour prior to CT scan. (Patient not taking: Reported on 08/15/2021) 3 tablet 6   predniSONE (DELTASONE) 50 MG tablet Take as directed prior to contrast imaging. (Patient not taking: Reported on 08/15/2021) 4 tablet 0   No current facility-administered medications for this visit.    PHYSICAL EXAMINATION: ECOG PERFORMANCE STATUS: 1 - Symptomatic but completely ambulatory  BP 117/78   Pulse 84   Temp 98.7 F (37.1 C)   Resp 16   Wt 149 lb (67.6 kg)   BMI 22.16 kg/m   Filed Weights   08/15/21 1356  Weight: 149 lb (67.6 kg)    Physical Exam Vitals and nursing note reviewed.  HENT:     Head: Normocephalic and atraumatic.     Mouth/Throat:     Pharynx: Oropharynx is clear.  Eyes:     Extraocular Movements: Extraocular movements intact.     Pupils: Pupils are equal, round, and reactive to light.  Cardiovascular:     Rate and Rhythm: Normal rate and regular rhythm.  Pulmonary:     Comments: Decreased breath sounds bilaterally.  Abdominal:     Palpations: Abdomen is soft.  Musculoskeletal:        General: Normal range of motion.     Cervical back: Normal range of motion.  Skin:    General: Skin is warm.  Neurological:     General: No focal deficit present.     Mental Status: She is alert and oriented to person, place, and time.  Psychiatric:        Behavior: Behavior normal.        Judgment: Judgment normal.       LABORATORY  DATA:  I have reviewed the data as listed    Component Value Date/Time   NA 138 08/15/2021 1340   K 4.4 08/15/2021 1340   CL 101 08/15/2021 1340   CO2 27 08/15/2021 1340   GLUCOSE 86 08/15/2021 1340   BUN 20 08/15/2021 1340   CREATININE 0.90 08/15/2021 1340   CALCIUM 9.3 08/15/2021  1340   PROT 6.8 08/15/2021 1340   ALBUMIN 3.8 08/15/2021 1340   AST 27 08/15/2021 1340   ALT 22 08/15/2021 1340   ALKPHOS 99 08/15/2021 1340   BILITOT 1.1 08/15/2021 1340   GFRNONAA >60 08/15/2021 1340   GFRAA >60 05/02/2017 0936    No results found for: SPEP, UPEP  Lab Results  Component Value Date   WBC 3.0 (L) 08/15/2021   NEUTROABS 1.6 (L) 08/15/2021   HGB 12.6 08/15/2021   HCT 34.7 (L) 08/15/2021   MCV 92.0 08/15/2021   PLT 241 08/15/2021      Chemistry      Component Value Date/Time   NA 138 08/15/2021 1340   K 4.4 08/15/2021 1340   CL 101 08/15/2021 1340   CO2 27 08/15/2021 1340   BUN 20 08/15/2021 1340   CREATININE 0.90 08/15/2021 1340      Component Value Date/Time   CALCIUM 9.3 08/15/2021 1340   ALKPHOS 99 08/15/2021 1340   AST 27 08/15/2021 1340   ALT 22 08/15/2021 1340   BILITOT 1.1 08/15/2021 1340       RADIOGRAPHIC STUDIES: I have personally reviewed the radiological images as listed and agreed with the findings in the report. No results found.   ASSESSMENT & PLAN:  Carcinoma of overlapping sites of left breast in female, estrogen receptor positive ([place]) #Recurrent stage IV ER positive HER2 negative breast cancer-currently on Femara plus on ribociclib - [3 weeks on 1 week off] DEC 4th, 2022- CT CAP-shows improvement of liver lesions; stable small sclerotic bone lesions; improvement of the interstitial pattern of the right lung opacities; small left-sided pleural effusion stable large to moderate-sized right-sided pleural effusion.  Currently s/p 3 cycles of Ribocilib +Femara.  #Proceed with cycle #4 of Ribociclib + Femara-tolerating well.  # Left chest wall  discomfort [3-4/10]-likely secondary to underlying pleural effusion/scarring rather than any progressive disease; will discuss with [facility] for any options.  #Right more than left pleural effusion-left-sided Pleurx cath thoracoscopy [followed by Dr.Wahidi in [place].  Still draining about 75cc OD.  Awaiting reevaluation at [facility] in the next couple weeks.  #  Sclerotic bone lesions-metastatic disease; Declined bisphosphonate therapy- continues to monitor for repeat imnaging at this time.   # Incidental findings on CT Imaging dated: 11/28, 2022: Small hiatal hernia; kidney cyst; small pericardial effusion I reviewed/discussed/counseled the patient.   # DISPOSITION: # follow up in 4 weeks-./Monday MD; labs- cbc/cmp; ca 27-29; CEA; Ca15-3;-Dr.B     Orders Placed This Encounter  Procedures   CBC with Differential/Platelet    Standing Status:   Future    Standing Expiration Date:   08/15/2022   Comprehensive metabolic panel    Standing Status:   Future    Standing Expiration Date:   08/15/2022   Cancer antigen 27.29    Standing Status:   Future    Standing Expiration Date:   08/15/2022   Cancer antigen 15-3    Standing Status:   Future    Standing Expiration Date:   08/15/2022   CEA    Standing Status:   Future    Standing Expiration Date:   08/15/2022    All questions were answered. The patient knows to call the clinic with any problems, questions or concerns.      Cammie Sickle, MD 08/15/2021 3:14 PM

## 2021-08-15 NOTE — Progress Notes (Signed)
[facility]  Telephone:([phone] [phone]  Patient Care Team: Rusty Aus, [name], [name], [name], [name], MD (Inactive) as Consulting Physician (Family Medicine) Lloyd Huger, MD as Consulting Physician (Hematology and Oncology)   Name of the patient: Jamie Wu  [id number]  [birth year]   Date of visit: 08/15/21  HPI: Patient is a 50 y.o. female with recurrent metastatic breast cancer. Currently treated with Kisqali (ribociclib) and letrozole. Started ribociclib on 05/22/21. She started her letrozole in 04/2021.  Reason for Consult: Oral chemotherapy follow-up for ribociclib therapy.   PAST MEDICAL HISTORY: Past Medical History:  Diagnosis Date   Breast cancer ([place]) 2008   left breast   Personal history of malignant neoplasm of breast 2008   L LUMPECTOMY   Personal history of radiation therapy 2008   LEFT lumpectomy 2008    HEMATOLOGY/ONCOLOGY HISTORY:  Oncology History Overview Note  # 2008- Left breast cancer [s/p Lumpec]; [name]; [name]; [name], [name], tamoxifen x 5 years, 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     % of Cells Staining Intensity Score Interpretation  ER IHC 73 2+ POSITIVE  PR IHC 4 3+ LOW POSITIVE  HER2/neu IHC 28 2+ EQUIVOCAL    HER2/NEU FISH RESULTS WILL BE ISSUED LATER IN THIS REPORT.  Electronically signed by FElspeth Cho MD on 05/04/2021 at  2:47 PM  Addendum    INTERPRETATION   HER2/Neu Gene Copy Number Centromere 17 (CEP17) Gene Copy Number HER2/Neu to CEP17 Ratio Interpretation  HER2/Neu FISH  2.4 1.6 1.65 NOT AMPLIFIED     CT- SEP 2022-Numerous new hepatic lesions compatible with metastatic disease, [name], [name], [name], [name], [name], estrogen receptor positive ([facility]  05/11/2021 Initial Diagnosis   Carcinoma of left breast in female, estrogen receptor positive ([facility]  05/11/2021 Cancer Staging   Staging form: Breast, AJCC 8th Edition - Clinical stage from 05/11/2021: Stage IV (rcTX, [name], pM1, [name], [name], [name], MD on 05/11/2021 Stage prefix:  Recurrence      ALLERGIES:  is allergic to contrast media [iodinated diagnostic agents] and sulfa antibiotics.  MEDICATIONS:  Current Outpatient Medications  Medication Sig Dispense Refill   albuterol (VENTOLIN HFA) 108 (90 Base) MCG/ACT inhaler Inhale into the lungs every 4 (four) hours as needed. (Patient not taking: Reported on 06/29/2021)     ascorbic acid (VITAMIN C) 500 MG tablet Take 1,000 mg by mouth 2 (two) times daily. (Patient not taking: Reported on 07/25/2021)     benzonatate (TESSALON) 100 MG capsule Take by mouth 3 (three) times daily as needed for cough. (Patient not taking: Reported on 06/29/2021)     Cholecalciferol 50 MCG (2000 UT) CAPS Take 1 capsule by mouth daily at 12 noon. (Patient not taking: Reported on 07/25/2021)     cyanocobalamin 1000 MCG tablet Take 1 tablet by mouth daily at 12 noon. (Patient not taking: Reported on 07/25/2021)     famotidine (PEPCID) 20 MG tablet Take 20 mg by mouth once. (Patient not taking: Reported on 06/29/2021)     letrozole (FEMARA) 2.5 MG tablet Take 1 tablet (2.5 mg total) by mouth daily. 90 tablet 1   levofloxacin (LEVAQUIN) 500 MG tablet Take 1 tablet (500 mg total) by mouth daily. (Patient not taking: Reported on 07/17/2021) 7 tablet 0   MISC NATURAL PRODUCTS PO Take 1 capsule by mouth daily. Vitamin K2 MK-7 150 mcg (Patient not taking: Reported on 07/25/2021)     montelukast (SINGULAIR) 10 MG tablet Take one pill 24 hours; and 2 hours prior to- IV dye/CT scan- (Patient not taking: Reported on 07/25/2021) 10 tablet 1   Multiple Vitamin (MULTI-VITAMIN) tablet Take 1 tablet by mouth daily. Liposomal multivitamin (Patient not taking: Reported on 07/17/2021)     pantoprazole (PROTONIX) 40 MG tablet Take 40 mg by mouth daily. (Patient not taking: Reported on 06/29/2021)     predniSONE (DELTASONE) 50 MG tablet Take 1 tablet- 17 hours; 7 hours; and 1 hour prior to CT scan. (Patient not taking: Reported on 08/15/2021) 3 tablet 6   predniSONE  (DELTASONE) 50 MG tablet Take as directed prior to contrast imaging. (Patient not taking: Reported on 08/15/2021) 4 tablet 0   ribociclib succ (KISQALI, 600 MG DOSE,) 200 MG Therapy Pack Take 3 tablets (600 mg total) by mouth daily. Take for 21 days on, 7 days off, repeat every 28 days. 63 tablet 2   No current facility-administered medications for this visit.    VITAL SIGNS: There were no vitals taken for this visit. There were no vitals filed for this visit.  Estimated body mass index is 22.16 kg/m as calculated from the following:   Height as of 07/25/21: 5' 8.75" (1.746 m).   Weight as of an earlier encounter on 08/15/21: 67.6 kg (149 lb).  LABS: CBC:    Component Value Date/Time   WBC 3.0 (L) 08/15/2021 1340   HGB 12.6 08/15/2021 1340   HCT 34.7 (L) 08/15/2021 1340   PLT 241 08/15/2021 1340   MCV 92.0 08/15/2021 1340   NEUTROABS 1.6 (L) 08/15/2021 1340   LYMPHSABS 0.7 08/15/2021 1340   MONOABS 0.5 08/15/2021 1340   EOSABS 0.1 08/15/2021 1340   BASOSABS 0.1 08/15/2021 1340   Comprehensive Metabolic Panel:    Component Value Date/Time   NA 138  08/15/2021 1340   K 4.4 08/15/2021 1340   CL 101 08/15/2021 1340   CO2 27 08/15/2021 1340   BUN 20 08/15/2021 1340   CREATININE 0.90 08/15/2021 1340   GLUCOSE 86 08/15/2021 1340   CALCIUM 9.3 08/15/2021 1340   AST 27 08/15/2021 1340   ALT 22 08/15/2021 1340   ALKPHOS 99 08/15/2021 1340   BILITOT 1.1 08/15/2021 1340   PROT 6.8 08/15/2021 1340   ALBUMIN 3.8 08/15/2021 1340     Present during today's visit: Patient only   Assessment and Plan: Reviewed CMP and CBC, neutrophils improved.   Continue ribociclib 600 mg 21 days on/7 days off. Today is C4D1.   Oral Chemotherapy Side Effect/Intolerance:  Constipation: Reports mild constipation which is is able to manage with Miralax Fatigue: overall the reports feeling well, she does have days of when she feels more tired than others mostly if she if feels like she over did it  the day before. She enjoys participating in the [place] [facility] No reported fatigue (reports good energy), diarrhea, rash, or nausea  Oral Chemotherapy Adherence: No reported missed doses. No patient barriers to medication adherence identified.   New medications: None reported.  Medication Access Issues: [facility] at [facility] with no issues.  Patient expressed understanding and was in agreement with this plan. She also understands that She can call clinic at any time with any questions, concerns, or complaints.   Follow-up plan: RTC in 4 weeks  Thank you for allowing me to participate in the care of this very pleasant patient.   Time Total: 15 minutes  Visit consisted of counseling and education on dealing with issues of symptom management in the setting of serious and potentially life-threatening illness.Greater than 50%  of this time was spent counseling and coordinating care related to the above assessment and plan.  Signed by: Darl Pikes, PharmD, BCPS, Salley Slaughter, CPP Hematology/Oncology Clinical Pharmacist Practitioner [facility] [phone]  08/15/2021 4:29 PM

## 2021-08-16 LAB — CEA: CEA: 13.7 ng/mL — ABNORMAL HIGH (ref 0.0–4.7)

## 2021-08-16 LAB — CANCER ANTIGEN 27.29: CA 27.29: 156 U/mL — ABNORMAL HIGH (ref 0.0–38.6)

## 2021-08-16 LAB — CANCER ANTIGEN 15-3: CA 15-3: 129 U/mL — ABNORMAL HIGH (ref 0.0–25.0)

## 2021-08-17 ENCOUNTER — Other Ambulatory Visit: Payer: Self-pay

## 2021-08-17 DIAGNOSIS — C50919 Malignant neoplasm of unspecified site of unspecified female breast: Secondary | ICD-10-CM

## 2021-08-17 NOTE — Progress Notes (Signed)
Daily Session Note  Patient Details  Name: Jamie Wu MRN: [id number] Date of Birth: 09-10-1971 Referring Provider:   April Manson [facility] Exercise from 07/25/2021 in [facility] Cardiac and Pulmonary Rehab  Referring Provider Charlaine Dalton MD       Encounter Date: 08/17/2021  Check In:  Session Check In - 08/17/21 1225       Check-In   Supervising physician immediately available to respond to emergencies See telemetry face sheet for immediately available ER MD    Location [facility]-Cardiac & Pulmonary Rehab    Staff Present Birdie Sons, MPA, RN;Amanda Sommer, BA, ACSM CEP, Exercise Physiologist;Kara Eliezer Bottom, MS, ASCM CEP, Exercise Physiologist    Virtual Visit No    Medication changes reported     No    Fall or balance concerns reported    No    Warm-up and Cool-down Performed on first and last piece of equipment    Resistance Training Performed Yes    VAD Patient? No    PAD/SET Patient? No      Pain Assessment   Currently in Pain? No/denies                Social History   Tobacco Use  Smoking Status Former   Packs/day: 1.00   Years: 3.00   Pack years: 3.00   Types: Cigarettes   Quit date: 09/10/1993   Years since quitting: 27.9  Smokeless Tobacco Never    Goals Met:  Independence with exercise equipment Exercise tolerated well No report of concerns or symptoms today Strength training completed today  Goals Unmet:  Not Applicable  Comments: Pt able to follow exercise prescription today without complaint.  Will continue to monitor for progression.  Talked to patient about deep breathing and breathing exercises. Will send note to Dr. B to talk about progression with loads.   Dr. Emily Filbert is Medical Director for [place].  Dr. Ottie Glazier is Medical Director for [facility] Pulmonary Rehabilitation.

## 2021-08-22 ENCOUNTER — Other Ambulatory Visit: Payer: Self-pay

## 2021-08-22 DIAGNOSIS — C50919 Malignant neoplasm of unspecified site of unspecified female breast: Secondary | ICD-10-CM

## 2021-08-22 NOTE — Progress Notes (Signed)
Daily Session Note  Patient Details  Name: Jamie Wu MRN: [id number] Date of Birth: 1971/01/20 Referring Provider:   April Manson Cancer Associated Rehabilitation & Exercise from 07/25/2021 in [facility] Cardiac and Pulmonary Rehab  Referring Provider Charlaine Dalton MD       Encounter Date: 08/22/2021  Check In:  Session Check In - 08/22/21 1216       Check-In   Supervising physician immediately available to respond to emergencies See telemetry face sheet for immediately available ER MD    Location [facility]-Cardiac & Pulmonary Rehab    Staff Present Birdie Sons, MPA, Nino Glow, MS, ASCM CEP, Exercise Physiologist;Jessica Luan Pulling, MA, RCEP, CCRP, CCET    Virtual Visit No    Medication changes reported     No    Fall or balance concerns reported    No    Warm-up and Cool-down Performed on first and last piece of equipment    Resistance Training Performed Yes    VAD Patient? No    PAD/SET Patient? No      Pain Assessment   Currently in Pain? No/denies                Social History   Tobacco Use  Smoking Status Former   Packs/day: 1.00   Years: 3.00   Pack years: 3.00   Types: Cigarettes   Quit date: 09/10/1993   Years since quitting: 27.9  Smokeless Tobacco Never    Goals Met:  Independence with exercise equipment Exercise tolerated well No report of concerns or symptoms today Strength training completed today  Goals Unmet:  Not Applicable  Comments: Pt able to follow exercise prescription today without complaint.  Will continue to monitor for progression.    Dr. Emily Filbert is Medical Director for [place].  Dr. Ottie Glazier is Medical Director for [facility] Pulmonary Rehabilitation.

## 2021-08-24 ENCOUNTER — Telehealth: Payer: Self-pay | Admitting: Pharmacist

## 2021-08-25 NOTE — Telephone Encounter (Signed)
Oral Chemotherapy Pharmacist Encounter   Patient reported on 08/24/21 she was having constipation this morning, but was able to have a bowel movement since she called. She has been taking both Miralax and bisacodyl, but only as needed. Suggested she take both daily until her bowel movements return to normal. She can use docusate as a prn option as needed  Called on 08/25/21 to check on patient, LVM for patient to call me back.  Darl Pikes, PharmD, BCPS, BCOP, CPP Hematology/Oncology Clinical Pharmacist [place]/DB/AP Oral [facility] [phone]  08/25/2021 2:33 PM

## 2021-08-25 NOTE — Telephone Encounter (Signed)
Oral Chemotherapy Pharmacist Encounter   Ms. Pribyl returned my call, she noticed last night that her bowels were loose so she held off on any more constipation medication. Today is going okay so far.  She also mentioned that she was having some reflux so she started back taking her famotidine.    Darl Pikes, PharmD, BCPS, BCOP, CPP Hematology/Oncology Clinical Pharmacist [place]/DB/AP Oral [facility] [phone]  08/25/2021 3:32 PM

## 2021-08-29 ENCOUNTER — Other Ambulatory Visit: Payer: Self-pay

## 2021-08-29 DIAGNOSIS — C50919 Malignant neoplasm of unspecified site of unspecified female breast: Secondary | ICD-10-CM

## 2021-08-29 NOTE — Progress Notes (Signed)
Daily Session Note  Patient Details  Name: Jamie Wu MRN: [id number] Date of Birth: 12-Nov-1970 Referring Provider:   April Manson Cancer Associated Rehabilitation & Exercise from 07/25/2021 in [facility] Cardiac and Pulmonary Rehab  Referring Provider Charlaine Dalton MD       Encounter Date: 08/29/2021  Check In:  Session Check In - 08/29/21 1226       Check-In   Supervising physician immediately available to respond to emergencies See telemetry face sheet for immediately available ER MD    Location [facility]-Cardiac & Pulmonary Rehab    Staff Present Birdie Sons, MPA, Nino Glow, MS, ASCM CEP, Exercise Physiologist;Jessica Luan Pulling, MA, RCEP, CCRP, CCET    Virtual Visit No    Medication changes reported     No    Fall or balance concerns reported    No    Warm-up and Cool-down Performed on first and last piece of equipment    Resistance Training Performed Yes    VAD Patient? No    PAD/SET Patient? No      Pain Assessment   Currently in Pain? No/denies                Social History   Tobacco Use  Smoking Status Former   Packs/day: 1.00   Years: 3.00   Pack years: 3.00   Types: Cigarettes   Quit date: 09/10/1993   Years since quitting: 27.9  Smokeless Tobacco Never    Goals Met:  Independence with exercise equipment Exercise tolerated well No report of concerns or symptoms today Strength training completed today  Goals Unmet:  Not Applicable  Comments: Pt able to follow exercise prescription today without complaint.  Will continue to monitor for progression.    Dr. Emily Filbert is Medical Director for [place].  Dr. Ottie Glazier is Medical Director for [facility] Pulmonary Rehabilitation.

## 2021-08-31 ENCOUNTER — Other Ambulatory Visit: Payer: Self-pay

## 2021-08-31 ENCOUNTER — Encounter: Payer: 59 | Admitting: *Deleted

## 2021-08-31 DIAGNOSIS — C50919 Malignant neoplasm of unspecified site of unspecified female breast: Secondary | ICD-10-CM

## 2021-08-31 NOTE — Progress Notes (Signed)
Daily Session Note  Patient Details  Name: Jamie Wu MRN: [id number] Date of Birth: April 16, 1971 Referring Provider:   April Manson Cancer Associated Rehabilitation & Exercise from 07/25/2021 in [facility] Cardiac and Pulmonary Rehab  Referring Provider Charlaine Dalton MD       Encounter Date: 08/31/2021  Check In:  Session Check In - 08/31/21 1244       Check-In   Supervising physician immediately available to respond to emergencies See telemetry face sheet for immediately available ER MD    Location [facility]-Cardiac & Pulmonary Rehab    Staff Present Alberteen Sam, MA, RCEP, CCRP, CCET;Amanda Sommer, BA, ACSM CEP, Exercise Physiologist    Virtual Visit No    Medication changes reported     No    Fall or balance concerns reported    No    Warm-up and Cool-down Performed as group-led instruction    Resistance Training Performed Yes    VAD Patient? No    PAD/SET Patient? No      Pain Assessment   Currently in Pain? No/denies                Social History   Tobacco Use  Smoking Status Former   Packs/day: 1.00   Years: 3.00   Pack years: 3.00   Types: Cigarettes   Quit date: 09/10/1993   Years since quitting: 27.9  Smokeless Tobacco Never    Goals Met:  Proper associated with RPD/PD & O2 Sat Independence with exercise equipment Exercise tolerated well No report of concerns or symptoms today Strength training completed today  Goals Unmet:  Not Applicable  Comments: Pt able to follow exercise prescription today without complaint.  Will continue to monitor for progression.    Dr. Emily Filbert is Medical Director for [place].  Dr. Ottie Glazier is Medical Director for [facility] Pulmonary Rehabilitation.

## 2021-09-07 ENCOUNTER — Other Ambulatory Visit: Payer: Self-pay

## 2021-09-07 DIAGNOSIS — C50919 Malignant neoplasm of unspecified site of unspecified female breast: Secondary | ICD-10-CM

## 2021-09-07 NOTE — Progress Notes (Signed)
Daily Session Note  Patient Details  Name: Jamie Wu MRN: [id number] Date of Birth: 1971/08/31 Referring Provider:   April Manson Cancer Associated Rehabilitation & Exercise from 07/25/2021 in [facility] Cardiac and Pulmonary Rehab  Referring Provider Charlaine Dalton MD       Encounter Date: 09/07/2021  Check In:  Session Check In - 09/07/21 1335       Check-In   Supervising physician immediately available to respond to emergencies See telemetry face sheet for immediately available ER MD    Location [facility]-Cardiac & Pulmonary Rehab    Staff Present Coralie Keens, MS, ASCM CEP, Exercise Physiologist;Amanda Oletta Darter, BA, ACSM CEP, Exercise Physiologist    Virtual Visit No    Medication changes reported     No    Fall or balance concerns reported    No    Warm-up and Cool-down Performed on first and last piece of equipment    Resistance Training Performed Yes    VAD Patient? No    PAD/SET Patient? No                Social History   Tobacco Use  Smoking Status Former   Packs/day: 1.00   Years: 3.00   Pack years: 3.00   Types: Cigarettes   Quit date: 09/10/1993   Years since quitting: 28.0  Smokeless Tobacco Never    Goals Met:  Independence with exercise equipment Exercise tolerated well No report of concerns or symptoms today Strength training completed today  Goals Unmet:  Not Applicable  Comments: Pt able to follow exercise prescription today without complaint.  Will continue to monitor for progression.    Dr. Emily Filbert is Medical Director for [place].  Dr. Ottie Glazier is Medical Director for [facility] Pulmonary Rehabilitation.

## 2021-09-12 ENCOUNTER — Other Ambulatory Visit: Payer: Self-pay

## 2021-09-12 ENCOUNTER — Inpatient Hospital Stay: Payer: 59 | Admitting: Pharmacist

## 2021-09-12 ENCOUNTER — Inpatient Hospital Stay (HOSPITAL_BASED_OUTPATIENT_CLINIC_OR_DEPARTMENT_OTHER): Payer: 59 | Admitting: Internal Medicine

## 2021-09-12 ENCOUNTER — Encounter: Payer: Self-pay | Admitting: Internal Medicine

## 2021-09-12 ENCOUNTER — Inpatient Hospital Stay: Payer: 59 | Attending: Internal Medicine

## 2021-09-12 ENCOUNTER — Encounter: Payer: 59 | Attending: Internal Medicine | Admitting: *Deleted

## 2021-09-12 ENCOUNTER — Other Ambulatory Visit: Payer: Self-pay | Admitting: Hospice and Palliative Medicine

## 2021-09-12 DIAGNOSIS — C782 Secondary malignant neoplasm of pleura: Secondary | ICD-10-CM | POA: Insufficient documentation

## 2021-09-12 DIAGNOSIS — Z800 Family history of malignant neoplasm of digestive organs: Secondary | ICD-10-CM | POA: Diagnosis not present

## 2021-09-12 DIAGNOSIS — C50919 Malignant neoplasm of unspecified site of unspecified female breast: Secondary | ICD-10-CM | POA: Insufficient documentation

## 2021-09-12 DIAGNOSIS — Z79899 Other long term (current) drug therapy: Secondary | ICD-10-CM | POA: Diagnosis not present

## 2021-09-12 DIAGNOSIS — C50812 Malignant neoplasm of overlapping sites of left female breast: Secondary | ICD-10-CM

## 2021-09-12 DIAGNOSIS — Z170 Estrogen receptor positive status [ER+]: Secondary | ICD-10-CM | POA: Insufficient documentation

## 2021-09-12 DIAGNOSIS — Z79811 Long term (current) use of aromatase inhibitors: Secondary | ICD-10-CM | POA: Diagnosis not present

## 2021-09-12 DIAGNOSIS — M25549 Pain in joints of unspecified hand: Secondary | ICD-10-CM | POA: Insufficient documentation

## 2021-09-12 DIAGNOSIS — C7951 Secondary malignant neoplasm of bone: Secondary | ICD-10-CM | POA: Insufficient documentation

## 2021-09-12 DIAGNOSIS — K769 Liver disease, unspecified: Secondary | ICD-10-CM | POA: Insufficient documentation

## 2021-09-12 DIAGNOSIS — Z8616 Personal history of COVID-19: Secondary | ICD-10-CM | POA: Diagnosis not present

## 2021-09-12 DIAGNOSIS — J90 Pleural effusion, not elsewhere classified: Secondary | ICD-10-CM | POA: Diagnosis not present

## 2021-09-12 DIAGNOSIS — Z87891 Personal history of nicotine dependence: Secondary | ICD-10-CM | POA: Insufficient documentation

## 2021-09-12 LAB — CBC WITH DIFFERENTIAL/PLATELET
Abs Immature Granulocytes: 0.01 10*3/uL (ref 0.00–0.07)
Basophils Absolute: 0.1 10*3/uL (ref 0.0–0.1)
Basophils Relative: 3 %
Eosinophils Absolute: 0.1 10*3/uL (ref 0.0–0.5)
Eosinophils Relative: 3 %
HCT: 35.4 % — ABNORMAL LOW (ref 36.0–46.0)
Hemoglobin: 12.7 g/dL (ref 12.0–15.0)
Immature Granulocytes: 0 %
Lymphocytes Relative: 26 %
Lymphs Abs: 0.7 10*3/uL (ref 0.7–4.0)
MCH: 34.1 pg — ABNORMAL HIGH (ref 26.0–34.0)
MCHC: 35.9 g/dL (ref 30.0–36.0)
MCV: 95.2 fL (ref 80.0–100.0)
Monocytes Absolute: 0.5 10*3/uL (ref 0.1–1.0)
Monocytes Relative: 20 %
Neutro Abs: 1.2 10*3/uL — ABNORMAL LOW (ref 1.7–7.7)
Neutrophils Relative %: 48 %
Platelets: 237 10*3/uL (ref 150–400)
RBC: 3.72 MIL/uL — ABNORMAL LOW (ref 3.87–5.11)
RDW: 14.1 % (ref 11.5–15.5)
WBC: 2.6 10*3/uL — ABNORMAL LOW (ref 4.0–10.5)
nRBC: 0 % (ref 0.0–0.2)

## 2021-09-12 LAB — COMPREHENSIVE METABOLIC PANEL
ALT: 23 U/L (ref 0–44)
AST: 30 U/L (ref 15–41)
Albumin: 3.8 g/dL (ref 3.5–5.0)
Alkaline Phosphatase: 110 U/L (ref 38–126)
Anion gap: 8 (ref 5–15)
BUN: 12 mg/dL (ref 6–20)
CO2: 27 mmol/L (ref 22–32)
Calcium: 9.1 mg/dL (ref 8.9–10.3)
Chloride: 102 mmol/L (ref 98–111)
Creatinine, Ser: 0.92 mg/dL (ref 0.44–1.00)
GFR, Estimated: >60 mL/min (ref >60)
Glucose, Bld: 73 mg/dL (ref 70–99)
Potassium: 4 mmol/L (ref 3.5–5.1)
Sodium: 137 mmol/L (ref 135–145)
Total Bilirubin: 0.6 mg/dL (ref 0.3–1.2)
Total Protein: 7.1 g/dL (ref 6.5–8.1)

## 2021-09-12 NOTE — Progress Notes (Signed)
[place]  Telephone:([phone] [phone]  Patient Care Team: Rusty Aus, [name], [name], [name], [name], MD (Inactive) as Consulting Physician (Family Medicine) Lloyd Huger, MD as Consulting Physician (Hematology and Oncology)   Name of the patient: Jamie Wu  [id number]  14-Nov-1970   Date of visit: 09/12/21  HPI: Patient is a 51 y.o. female with recurrent metastatic breast cancer. Currently treated with Kisqali (ribociclib) and letrozole. Started ribociclib on 05/22/21. She started her letrozole in 04/2021.  Reason for Consult: Oral chemotherapy follow-up for ribociclib therapy.   PAST MEDICAL HISTORY: Past Medical History:  Diagnosis Date   Breast cancer ([place]) 2008   left breast   Personal history of malignant neoplasm of breast 2008   L LUMPECTOMY   Personal history of radiation therapy 2008   LEFT lumpectomy 2008    HEMATOLOGY/ONCOLOGY HISTORY:  Oncology History Overview Note  # 2008- Left breast cancer [s/p Lumpec]; [name]; [name]; [name], [name], tamoxifen x 5 years, 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     % of Cells Staining Intensity Score Interpretation  ER IHC 73 2+ POSITIVE  PR IHC 4 3+ LOW POSITIVE  HER2/neu IHC 28 2+ EQUIVOCAL    HER2/NEU FISH RESULTS WILL BE ISSUED LATER IN THIS REPORT.  Electronically signed by FElspeth Cho MD on 05/04/2021 at  2:47 PM  Addendum    INTERPRETATION   HER2/Neu Gene Copy Number Centromere 17 ([id number]) Gene Copy Number HER2/Neu to CEP17 Ratio Interpretation  HER2/Neu FISH  2.4 1.6 1.65 NOT AMPLIFIED     CT- SEP 2022-Numerous new hepatic lesions compatible with metastatic disease, [name], [name], [name], [name], [name], estrogen receptor positive ([place]  05/11/2021 Initial Diagnosis   Carcinoma of left breast in female, estrogen receptor positive ([place]  05/11/2021 Cancer Staging   Staging form: Breast, AJCC 8th Edition - Clinical stage from 05/11/2021: Stage IV (rcTX, [name], pM1, [name], [name], [name], MD on 05/11/2021 Stage prefix:  Recurrence      ALLERGIES:  is allergic to contrast media [iodinated contrast media] and sulfa antibiotics.  MEDICATIONS:  Current Outpatient Medications  Medication Sig Dispense Refill   albuterol (VENTOLIN HFA) 108 (90 Base) MCG/ACT inhaler Inhale into the lungs every 4 (four) hours as needed. (Patient not taking: Reported on 06/29/2021)     ascorbic acid (VITAMIN C) 500 MG tablet Take 1,000 mg by mouth 2 (two) times daily. (Patient not taking: Reported on 07/25/2021)     benzonatate (TESSALON) 100 MG capsule Take by mouth 3 (three) times daily as needed for cough. (Patient not taking: Reported on 06/29/2021)     Cholecalciferol 50 MCG (2000 UT) CAPS Take 1 capsule by mouth daily at 12 noon. (Patient not taking: Reported on 07/25/2021)     cyanocobalamin 1000 MCG tablet Take 1 tablet by mouth daily at 12 noon. (Patient not taking: Reported on 07/25/2021)     famotidine (PEPCID) 20 MG tablet Take 20 mg by mouth once. (Patient not taking: Reported on 06/29/2021)     letrozole (FEMARA) 2.5 MG tablet Take 1 tablet (2.5 mg total) by mouth daily. 90 tablet 1   levofloxacin (LEVAQUIN) 500 MG tablet Take 1 tablet (500 mg total) by mouth daily. (Patient not taking: Reported on 07/17/2021) 7 tablet 0   MISC NATURAL PRODUCTS PO Take 1 capsule by mouth daily. Vitamin K2 MK-7 150 mcg (Patient not taking: Reported on 07/25/2021)     montelukast (SINGULAIR) 10 MG tablet Take one pill 24 hours; and 2 hours prior to- IV dye/CT scan- (Patient not taking: Reported on 07/25/2021) 10 tablet 1   Multiple Vitamin (MULTI-VITAMIN) tablet Take 1 tablet by mouth daily. Liposomal multivitamin (Patient not taking: Reported on 07/17/2021)     pantoprazole (PROTONIX) 40 MG tablet Take 40 mg by mouth daily. (Patient not taking: Reported on 06/29/2021)     predniSONE (DELTASONE) 50 MG tablet Take 1 tablet- 17 hours; 7 hours; and 1 hour prior to CT scan. (Patient not taking: Reported on 08/15/2021) 3 tablet 6   predniSONE  (DELTASONE) 50 MG tablet Take as directed prior to contrast imaging. (Patient not taking: Reported on 08/15/2021) 4 tablet 0   ribociclib succ (KISQALI, 600 MG DOSE,) 200 MG Therapy Pack Take 3 tablets (600 mg total) by mouth daily. Take for 21 days on, 7 days off, repeat every 28 days. 63 tablet 2   No current facility-administered medications for this visit.    VITAL SIGNS: There were no vitals taken for this visit. There were no vitals filed for this visit.  Estimated body mass index is 22.16 kg/m as calculated from the following:   Height as of 07/25/21: 5' 8.75" (1.746 m).   Weight as of 08/15/21: 67.6 kg (149 lb).  LABS: CBC:    Component Value Date/Time   WBC 3.0 (L) 08/15/2021 1340   HGB 12.6 08/15/2021 1340   HCT 34.7 (L) 08/15/2021 1340   PLT 241 08/15/2021 1340   MCV 92.0 08/15/2021 1340   NEUTROABS 1.6 (L) 08/15/2021 1340   LYMPHSABS 0.7 08/15/2021 1340   MONOABS 0.5 08/15/2021 1340   EOSABS 0.1 08/15/2021 1340   BASOSABS 0.1 08/15/2021 1340   Comprehensive Metabolic Panel:    Component Value Date/Time   NA 138 08/15/2021 1340  K 4.4 08/15/2021 1340   CL 101 08/15/2021 1340   CO2 27 08/15/2021 1340   BUN 20 08/15/2021 1340   CREATININE 0.90 08/15/2021 1340   GLUCOSE 86 08/15/2021 1340   CALCIUM 9.3 08/15/2021 1340   AST 27 08/15/2021 1340   ALT 22 08/15/2021 1340   ALKPHOS 99 08/15/2021 1340   BILITOT 1.1 08/15/2021 1340   PROT 6.8 08/15/2021 1340   ALBUMIN 3.8 08/15/2021 1340     Present during today's visit: Patient only   Assessment and Plan: Reviewed CMP and CBC with patient, continue ribociclib 600 mg 21 days on/7 days off.   Medication reconciliation completed during today visit.  Referral made to patient counseling with Conception Chancy, PsyD    Oral Chemotherapy Side Effect/Intolerance:  Fatigue: patient continues to have occasional fatigue. She describes it as feeling fine for several days and like her normal self then feeling hit she  hits a wall and is very tired. This happens about 7-8 times per month. She manages these days by resting No reported constipation, diarrhea, or nausea  Oral Chemotherapy Adherence: No reported missed doses. No patient barriers to medication adherence identified.   New medications: None reported.  Medication Access Issues: Patient fills at [facility] with no issues, refill sent in  Patient expressed understanding and was in agreement with this plan. She also understands that She can call clinic at any time with any questions, concerns, or complaints.   Follow-up plan: RTC in 4 weeks  Thank you for allowing me to participate in the care of this very pleasant patient.   Time Total: 20 minutes  Visit consisted of counseling and education on dealing with issues of symptom management in the setting of serious and potentially life-threatening illness.Greater than 50%  of this time was spent counseling and coordinating care related to the above assessment and plan.  Signed by: Darl Pikes, PharmD, BCPS, Salley Slaughter, CPP Hematology/Oncology Clinical Pharmacist Practitioner [place]/DB/AP Oral [facility] [phone]  09/12/2021 10:21 AM

## 2021-09-12 NOTE — Progress Notes (Signed)
Pt states she has Rt ankle pain, inflammation in hands x3 days and not being able to sleep. Also, she is having some left chest discomfort today.

## 2021-09-12 NOTE — Progress Notes (Signed)
Daily Session Note  Patient Details  Name: Jamie Wu MRN: [id number] Date of Birth: [birth year] Referring Provider:   April Manson Cancer Associated Rehabilitation & Exercise from 07/25/2021 in [facility] Cardiac and Pulmonary Rehab  Referring Provider Charlaine Dalton MD       Encounter Date: 09/12/2021  Check In:  Session Check In - 09/12/21 1224       Check-In   Supervising physician immediately available to respond to emergencies See telemetry face sheet for immediately available ER MD    Location [facility]-Cardiac & Pulmonary Rehab    Staff Present Alberteen Sam, MA, RCEP, CCRP, Marylynn Pearson, MS, ASCM CEP, Exercise Physiologist    Virtual Visit No    Medication changes reported     No    Fall or balance concerns reported    No    Warm-up and Cool-down Performed on first and last piece of equipment    Resistance Training Performed Yes    VAD Patient? No    PAD/SET Patient? No      Pain Assessment   Currently in Pain? No/denies                Social History   Tobacco Use  Smoking Status Former   Packs/day: 1.00   Years: 3.00   Pack years: 3.00   Types: Cigarettes   Quit date: 09/10/1993   Years since quitting: 28.0  Smokeless Tobacco Never    Goals Met:  Proper associated with RPD/PD & O2 Sat Independence with exercise equipment Exercise tolerated well No report of concerns or symptoms today Strength training completed today  Goals Unmet:  Not Applicable  Comments: Pt able to follow exercise prescription today without complaint.  Will continue to monitor for progression.  Angie noted that she was awaken by pain on the side of her catheter and when she tested it, she with drew blood.  She is going to see her doctor this afternoon.    Dr. Emily Filbert is Medical Director for [place].  Dr. Ottie Glazier is Medical Director for [facility] Pulmonary Rehabilitation.

## 2021-09-12 NOTE — Progress Notes (Signed)
[place] OFFICE PROGRESS NOTE  Patient Care Team: Rusty Aus, [name], [name], [name], [name], MD (Inactive) as Consulting Physician (Family Medicine) Lloyd Huger, MD as Consulting Physician (Hematology and Oncology)   Cancer Staging  Carcinoma of overlapping sites of left breast in female, estrogen receptor positive [facility]) Staging form: Breast, AJCC 8th Edition - Clinical stage from 05/11/2021: Stage IV (rcTX, [name], pM1, [name], [name], [name], MD on 05/11/2021 Stage prefix: Recurrence    Oncology History Overview Note  # 2008- Left breast cancer [s/p Lumpec]; [name]; [name]; [name], [name], tamoxifen x 5 years, 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     % of Cells Staining Intensity Score Interpretation  ER IHC 73 2+ POSITIVE  PR IHC 4 3+ LOW POSITIVE  HER2/neu IHC 28 2+ EQUIVOCAL    HER2/NEU FISH RESULTS WILL BE ISSUED LATER IN THIS REPORT.  Electronically signed by FElspeth Cho MD on 05/04/2021 at  2:47 PM  Addendum    INTERPRETATION    HER2/Neu Gene Copy Number Centromere 17 ([id number]) Gene Copy Number HER2/Neu to CEP17 Ratio Interpretation  HER2/Neu FISH  2.4 1.6 1.65 NOT AMPLIFIED     CT- SEP 2022-Numerous new hepatic lesions compatible with metastatic disease, [name], [name], [name], [name], [name], estrogen receptor positive ([place]  05/11/2021 Initial Diagnosis   Carcinoma of left breast in [place], [place]   05/11/2021 Cancer Staging   Staging form: Breast, AJCC 8th Edition - Clinical stage from 05/11/2021: Stage IV (rcTX, [name], pM1, [name], [name], HER2-) - Signed by FLloyd Huger MD on 05/11/2021 Stage prefix: Recurrence      INTERVAL HISTORY: Ambulating independently.  Alone.   Jamie [id number].  female pleasant patient above history of + metastatic recurrent  ER/PR positive breast cancer is on letrozole plus ribociclib is here for follow-up.  Patient continues to complain of intermittent left anterior chest wall pain discomfort-also posteriorly.  She is on NSAIDs.  She is currently awaiting reevaluation with  pulmonary at [facility] regarding explantation of the Pleurx catheter.  Denies any headaches.  Any fevers or chills.  Complains of mild swelling in the small joints of the hand.  Review of Systems  Constitutional:  Positive for malaise/fatigue and weight loss. Negative for chills, [name], sputum production and wheezing.   Cardiovascular:  Negative for chest pain, palpitations, orthopnea and leg swelling.  Gastrointestinal:  Negative for abdominal pain, blood in stool, constipation, diarrhea, heartburn, melena, nausea and vomiting.  Genitourinary:  Negative for dysuria, frequency and urgency.  Musculoskeletal:  Positive for joint pain. Negative for back pain.  Skin: Negative.  Negative for itching and rash.  Neurological:  Negative for dizziness, tingling, focal weakness, weakness and headaches.  Endo/Heme/Allergies:  Does not bruise/bleed easily.  Psychiatric/Behavioral:  Negative for depression. The patient is not nervous/anxious and does not have insomnia.      PAST MEDICAL HISTORY :  Past Medical History:  Diagnosis Date   Breast cancer ([place]) 2008   left breast   Personal history of malignant neoplasm of breast 2008   L LUMPECTOMY   Personal history of radiation therapy 2008   LEFT lumpectomy 2008    PAST SURGICAL HISTORY :   Past Surgical History:  Procedure Laterality Date   BREAST BIOPSY Right June 25, 2012   MRI abnormality showing fibrocystic changes with microcalcifications.   BREAST EXCISIONAL BIOPSY Left 2008   breast ca   BREAST LUMPECTOMY Left 2008   CHOLECYSTECTOMY  1998   COLONOSCOPY WITH PROPOFOL N/A 07/29/2020   Procedure: COLONOSCOPY WITH PROPOFOL;  [name], MD;  [name];  Service: Endoscopy;  [name];    FAMILY HISTORY :   Family History  Problem Relation Age of Onset    Colon cancer Paternal Uncle        lat3 80's    Breast cancer Neg Hx     SOCIAL HISTORY:   Social History   Tobacco Use   Smoking status: Former    Packs/day: 1.00    Years: 3.00    Pack years: 3.00    Types: Cigarettes    Quit date: 09/10/1993    Years since quitting: 28.0   Smokeless tobacco: Never  Vaping Use   Vaping Use: Never used  Substance Use Topics   Alcohol use: Yes    Alcohol/week: 0.0 standard drinks    Comment: occas-wine   Drug use: No    ALLERGIES:  is allergic to contrast media [iodinated contrast media] and sulfa antibiotics.  MEDICATIONS:  Current Outpatient Medications  Medication Sig Dispense Refill   letrozole (FEMARA) 2.5 MG tablet Take 1 tablet (2.5 mg total) by mouth daily. 90 tablet 1   ribociclib succ (KISQALI, 600 MG DOSE,) 200 MG Therapy Pack Take 3 tablets (600 mg total) by mouth daily. Take for 21 days on, 7 days off, repeat every 28 days. 63 tablet 2   famotidine (PEPCID) 20 MG tablet Take 20 mg by mouth once. (Patient not taking: Reported on 06/29/2021)     montelukast (SINGULAIR) 10 MG tablet Take one pill 24 hours; and 2 hours prior to- IV  dye/CT scan- (Patient not taking: Reported on 07/25/2021) 10 tablet 1   predniSONE (DELTASONE) 50 MG tablet Take 50 mg by mouth. Take 1 tablet- 13 hours; 7 hours; and 1 hour prior to CT scan.     No current facility-administered medications for this visit.    PHYSICAL EXAMINATION: ECOG PERFORMANCE STATUS: 1 - Symptomatic but completely ambulatory  BP 136/88 (BP Location: Right Arm, Patient Position: Sitting, Cuff Size: Normal)    Pulse 75    Temp 98.3 F (36.8 C) (Tympanic)    Ht 5' 8.75" (1.746 m)    Wt 152 lb (68.9 kg)    SpO2 100%    BMI 22.61 kg/m   Filed Weights   09/12/21 1041  Weight: 152 lb (68.9 kg)    Physical Exam Vitals and nursing note reviewed.  HENT:     Head: Normocephalic and atraumatic.     Mouth/Throat:     Pharynx: Oropharynx is clear.  Eyes:     Extraocular Movements:  Extraocular movements intact.     Pupils: Pupils are equal, round, and reactive to light.  Cardiovascular:     Rate and Rhythm: Normal rate and regular rhythm.  Pulmonary:     Comments: Decreased breath sounds bilaterally.  Abdominal:     Palpations: Abdomen is soft.  Musculoskeletal:        General: Normal range of motion.     Cervical back: Normal range of motion.  Skin:    General: Skin is warm.  Neurological:     General: No focal deficit present.     Mental Status: She is alert and oriented to person, place, and time.  Psychiatric:        Behavior: Behavior normal.        Judgment: Judgment normal.       LABORATORY DATA:  I have reviewed the data as listed    Component Value Date/Time   NA 137 09/12/2021 1019   K 4.0 09/12/2021 1019   CL 102 09/12/2021 1019   CO2 27 09/12/2021 1019   GLUCOSE 73 09/12/2021 1019   BUN 12 09/12/2021 1019   CREATININE 0.92 09/12/2021 1019   CALCIUM 9.1 09/12/2021 1019   PROT 7.1 09/12/2021 1019   ALBUMIN 3.8 09/12/2021 1019   AST 30 09/12/2021 1019   ALT 23 09/12/2021 1019   ALKPHOS 110 09/12/2021 1019   BILITOT 0.6 09/12/2021 1019   GFRNONAA >60 09/12/2021 1019   GFRAA >60 05/02/2017 0936    No results found for: SPEP, UPEP  Lab Results  Component Value Date   WBC 2.6 (L) 09/12/2021   NEUTROABS 1.2 (L) 09/12/2021   HGB 12.7 09/12/2021   HCT 35.4 (L) 09/12/2021   MCV 95.2 09/12/2021   PLT 237 09/12/2021      Chemistry      Component Value Date/Time   NA 137 09/12/2021 1019   K 4.0 09/12/2021 1019   CL 102 09/12/2021 1019   CO2 27 09/12/2021 1019   BUN 12 09/12/2021 1019   CREATININE 0.92 09/12/2021 1019      Component Value Date/Time   CALCIUM 9.1 09/12/2021 1019   ALKPHOS 110 09/12/2021 1019   AST 30 09/12/2021 1019   ALT 23 09/12/2021 1019   BILITOT 0.6 09/12/2021 1019       RADIOGRAPHIC STUDIES: I have personally reviewed the radiological images as listed and agreed with the findings in the  report. No results found.   ASSESSMENT & PLAN:  Carcinoma of overlapping sites of  left breast in female, estrogen receptor positive ([place]) #Recurrent stage IV ER positive HER2 negative breast cancer-currently on Femara plus on ribociclib - [3 weeks on 1 week off] DEC 4th, 2022- CT CAP-shows improvement of liver lesions; stable small sclerotic bone lesions; improvement of the interstitial pattern of the right lung opacities; small left-sided pleural effusion stable large to moderate-sized right-sided pleural effusion.  Currently on Ribocilib +Femara.  December 2022 tumor markers improving.  #Proceed with cycle #5 of Ribociclib + Femara-tolerating well. Labs today reviewed;  acceptable for treatment today.   #Hand joint pains-likely secondary to Femara.  Recommend calcium plus vitamin D.   # Right more than left pleural effusion-left-sided Pleurx cath thoracoscopy [followed by Dr.Wahidi in [place].  Left chest wall pain-?  Scar tissue/Pleurx catheter rather than true malignancy.  Bloody- 300 ml/ 5 days..  Awaiting reevaluation at [facility] today.  #  Sclerotic bone lesions-metastatic disease; Declined bisphosphonate therapy- continues to monitor for repeat imnaging at this time.   # DISPOSITION: # follow up in 4 weeks-./Monday MD; labs- cbc/cmp; ca 27-29; CEA; Ca15-3;vit D 25-OH levels--Dr.B      Orders Placed This Encounter  Procedures   CBC with Differential/Platelet    Standing Status:   Future    Standing Expiration Date:   09/12/2022   Comprehensive metabolic panel    Standing Status:   Future    Standing Expiration Date:   09/12/2022   Cancer antigen 27.29    Standing Status:   Future    Standing Expiration Date:   09/12/2022   Cancer antigen 15-3    Standing Status:   Future    Standing Expiration Date:   09/12/2022   CEA    Standing Status:   Future    Standing Expiration Date:   09/12/2022   VITAMIN D 25 Hydroxy (Vit-D Deficiency, Fractures)    Standing Status:   Future    Standing  Expiration Date:   09/12/2022    All questions were answered. The patient knows to call the clinic with any problems, questions or concerns.      Cammie Sickle, MD 09/12/2021 4:05 PM

## 2021-09-12 NOTE — Assessment & Plan Note (Addendum)
#  Recurrent stage IV ER positive HER2 negative breast cancer-currently on Femara plus on ribociclib - [3 weeks on 1 week off] DEC 4th, 2022- CT CAP-shows improvement of liver lesions; stable small sclerotic bone lesions; improvement of the interstitial pattern of the right lung opacities; small left-sided pleural effusion stable large to moderate-sized right-sided pleural effusion.  Currently on Ribocilib +Femara.  December 2022 tumor markers improving.  #Proceed with cycle #5 of Ribociclib + Femara-tolerating well. Labs today reviewed;  acceptable for treatment today.   #Hand joint pains-likely secondary to Femara.  Recommend calcium plus vitamin D.   # Right more than left pleural effusion-left-sided Pleurx cath thoracoscopy [followed by Dr.Wahidi in [place].  Left chest wall pain-?  Scar tissue/Pleurx catheter rather than true malignancy.  Bloody- 300 ml/ 5 days..  Awaiting reevaluation at [facility] today.  #  Sclerotic bone lesions-metastatic disease; Declined bisphosphonate therapy- continues to monitor for repeat imnaging at this time.   # DISPOSITION: # follow up in 4 weeks-./Monday MD; labs- cbc/cmp; ca 27-29; CEA; Ca15-3;vit D 25-OH levels--Dr.B

## 2021-09-13 DIAGNOSIS — Z452 Encounter for adjustment and management of vascular access device: Secondary | ICD-10-CM | POA: Diagnosis not present

## 2021-09-13 DIAGNOSIS — Z4803 Encounter for change or removal of drains: Secondary | ICD-10-CM | POA: Diagnosis not present

## 2021-09-13 DIAGNOSIS — Z9889 Other specified postprocedural states: Secondary | ICD-10-CM | POA: Diagnosis not present

## 2021-09-13 DIAGNOSIS — Z79811 Long term (current) use of aromatase inhibitors: Secondary | ICD-10-CM | POA: Diagnosis not present

## 2021-09-13 DIAGNOSIS — C50912 Malignant neoplasm of unspecified site of left female breast: Secondary | ICD-10-CM | POA: Diagnosis not present

## 2021-09-13 DIAGNOSIS — Z9049 Acquired absence of other specified parts of digestive tract: Secondary | ICD-10-CM | POA: Diagnosis not present

## 2021-09-13 DIAGNOSIS — Z170 Estrogen receptor positive status [ER+]: Secondary | ICD-10-CM | POA: Diagnosis not present

## 2021-09-13 DIAGNOSIS — J90 Pleural effusion, not elsewhere classified: Secondary | ICD-10-CM | POA: Diagnosis not present

## 2021-09-13 LAB — CANCER ANTIGEN 15-3: CA 15-3: 137 U/mL — ABNORMAL HIGH (ref 0.0–25.0)

## 2021-09-13 LAB — CANCER ANTIGEN 27.29: CA 27.29: 150.7 U/mL — ABNORMAL HIGH (ref 0.0–38.6)

## 2021-09-13 LAB — CEA: CEA: 13.5 ng/mL — ABNORMAL HIGH (ref 0.0–4.7)

## 2021-09-13 MED ORDER — RIBOCICLIB SUCC (600 MG DOSE) 200 MG PO TBPK: 600.0000 mg | ORAL_TABLET | Freq: Every day | ORAL | 2 refills | Status: DC

## 2021-09-14 ENCOUNTER — Encounter: Payer: Self-pay | Admitting: Internal Medicine

## 2021-09-14 ENCOUNTER — Other Ambulatory Visit: Payer: Self-pay

## 2021-09-14 DIAGNOSIS — C50919 Malignant neoplasm of unspecified site of unspecified female breast: Secondary | ICD-10-CM

## 2021-09-14 NOTE — Progress Notes (Signed)
Daily Session Note  Patient Details  Name: CORLEY KOHLS MRN: [id number] Date of Birth: 08-01-1971 Referring Provider:   April Manson Cancer Associated Rehabilitation & Exercise from 07/25/2021 in [facility] Cardiac and Pulmonary Rehab  Referring Provider Charlaine Dalton MD       Encounter Date: 09/14/2021  Check In:  Session Check In - 09/14/21 1218       Check-In   Supervising physician immediately available to respond to emergencies See telemetry face sheet for immediately available ER MD    Location [facility]-Cardiac & Pulmonary Rehab    Staff Present Coralie Keens, MS, ASCM CEP, Exercise Physiologist;Amanda Oletta Darter, [place], ACSM CEP, Exercise Physiologist;Necole Minassian Rosalia Hammers, MPA, RN    Virtual Visit No    Medication changes reported     No    Fall or balance concerns reported    No    Warm-up and Cool-down Performed on first and last piece of equipment    Resistance Training Performed Yes   no weights; drain removed 09/13/21   VAD Patient? No    PAD/SET Patient? No      Pain Assessment   Currently in Pain? No/denies                Social History   Tobacco Use  Smoking Status Former   Packs/day: 1.00   Years: 3.00   Pack years: 3.00   Types: Cigarettes   Quit date: 09/10/1993   Years since quitting: 28.0  Smokeless Tobacco Never    Goals Met:  Independence with exercise equipment Exercise tolerated well No report of concerns or symptoms today Strength training completed today  Goals Unmet:  Not Applicable  Comments: Pt able to follow exercise prescription today without complaint.  Will continue to monitor for progression.  Angie had her catheter removed yesterday. They did not provide her with any activity restrictions, however, we told patient to refrain from using arms with exercise today as well as only doing ROM for resistance training.   Dr. Emily Filbert is Medical Director for [place].  Dr. Ottie Glazier is Medical Director for  [facility] Pulmonary Rehabilitation.

## 2021-09-18 ENCOUNTER — Encounter: Payer: Self-pay | Admitting: Internal Medicine

## 2021-09-18 NOTE — Progress Notes (Signed)
I spoke to patient regarding results of tumor marker / as expected of the course of the disease.  Reviewed [facility] pleural fluid studies agree likely colonization do not recommend antibiotics.  As per patient neck lymph nodes swelling fleeting/ improved with ibuprofen. monitor for now if continues to get worse would recommend imaging/biopsy.

## 2021-09-19 ENCOUNTER — Other Ambulatory Visit: Payer: Self-pay

## 2021-09-19 DIAGNOSIS — C50919 Malignant neoplasm of unspecified site of unspecified female breast: Secondary | ICD-10-CM

## 2021-09-19 NOTE — Progress Notes (Signed)
Daily Session Note  Patient Details  Name: SUMAIYA ARRUDA MRN: [id number] Date of Birth: 18-Jan-1971 Referring Provider:   April Manson Cancer Associated Rehabilitation & Exercise from 07/25/2021 in [facility] Cardiac and Pulmonary Rehab  Referring Provider Charlaine Dalton MD       Encounter Date: 09/19/2021  Check In:  Session Check In - 09/19/21 1226       Check-In   Supervising physician immediately available to respond to emergencies See telemetry face sheet for immediately available ER MD    Location [facility]-Cardiac & Pulmonary Rehab    Staff Present Birdie Sons, MPA, RN;Jessica [place], MA, RCEP, CCRP, Marylynn Pearson, MS, ASCM CEP, Exercise Physiologist    Virtual Visit No    Medication changes reported     No    Fall or balance concerns reported    No    Warm-up and Cool-down Performed on first and last piece of equipment    Resistance Training Performed Yes    VAD Patient? No    PAD/SET Patient? No      Pain Assessment   Currently in Pain? No/denies                Social History   Tobacco Use  Smoking Status Former   Packs/day: 1.00   Years: 3.00   Pack years: 3.00   Types: Cigarettes   Quit date: 09/10/1993   Years since quitting: 28.0  Smokeless Tobacco Never    Goals Met:  Independence with exercise equipment Exercise tolerated well No report of concerns or symptoms today Strength training completed today  Goals Unmet:  Not Applicable  Comments: Pt able to follow exercise prescription today without complaint.  Will continue to monitor for progression.    Dr. Emily Filbert is Medical Director for [place].  Dr. Ottie Glazier is Medical Director for [facility] Pulmonary Rehabilitation.

## 2021-09-20 NOTE — Telephone Encounter (Signed)
MD spoke to patient (note in chart)

## 2021-09-21 ENCOUNTER — Other Ambulatory Visit: Payer: Self-pay

## 2021-09-21 DIAGNOSIS — C50919 Malignant neoplasm of unspecified site of unspecified female breast: Secondary | ICD-10-CM

## 2021-09-21 NOTE — Progress Notes (Signed)
Daily Session Note  Patient Details  Name: Jamie Wu MRN: [id number] Date of Birth: [birth year] Referring Provider:   April Manson Cancer Associated Rehabilitation & Exercise from 07/25/2021 in [facility] Cardiac and Pulmonary Rehab  Referring Provider Charlaine Dalton MD       Encounter Date: 09/21/2021  Check In:  Session Check In - 09/21/21 1220       Check-In   Supervising physician immediately available to respond to emergencies See telemetry face sheet for immediately available ER MD    Location [facility]-Cardiac & Pulmonary Rehab    Staff Present Birdie Sons, MPA, Nino Glow, MS, ASCM CEP, Exercise Physiologist;Amanda Oletta Darter, BA, ACSM CEP, Exercise Physiologist    Virtual Visit No    Medication changes reported     No    Fall or balance concerns reported    No    Warm-up and Cool-down Performed on first and last piece of equipment    Resistance Training Performed Yes    VAD Patient? No    PAD/SET Patient? No      Pain Assessment   Currently in Pain? No/denies                Social History   Tobacco Use  Smoking Status Former   Packs/day: 1.00   Years: 3.00   Pack years: 3.00   Types: Cigarettes   Quit date: 09/10/1993   Years since quitting: 28.0  Smokeless Tobacco Never    Goals Met:  Independence with exercise equipment Exercise tolerated well No report of concerns or symptoms today Strength training completed today  Goals Unmet:  Not Applicable  Comments: Pt able to follow exercise prescription today without complaint.  Will continue to monitor for progression.    Dr. Emily Filbert is Medical Director for [place].  Dr. Ottie Glazier is Medical Director for [facility] Pulmonary Rehabilitation.

## 2021-09-26 ENCOUNTER — Other Ambulatory Visit: Payer: Self-pay

## 2021-09-26 DIAGNOSIS — C50919 Malignant neoplasm of unspecified site of unspecified female breast: Secondary | ICD-10-CM

## 2021-09-26 NOTE — Progress Notes (Signed)
Daily Session Note  Patient Details  Name: Jamie Wu MRN: [id number] Date of Birth: 01-28-1971 Referring Provider:   April Manson Cancer Associated Rehabilitation & Exercise from 07/25/2021 in [facility] Cardiac and Pulmonary Rehab  Referring Provider Charlaine Dalton MD       Encounter Date: 09/26/2021  Check In:  Session Check In - 09/26/21 1225       Check-In   Supervising physician immediately available to respond to emergencies See telemetry face sheet for immediately available ER MD    Location [facility]-Cardiac & Pulmonary Rehab    Staff Present Birdie Sons, MPA, Nino Glow, MS, ASCM CEP, Exercise Physiologist;Jessica Luan Pull[place], MA, RCEP, CCRP, CCET    Virtual Visit No    Medication changes reported     No    Fall or balance concerns reported    No    Warm-up and Cool-down Performed on first and last piece of equipment    Resistance Training Performed Yes    VAD Patient? No    PAD/SET Patient? No      Pain Assessment   Currently in Pain? No/denies                Social History   Tobacco Use  Smoking Status Former   Packs/day: 1.00   Years: 3.00   Pack years: 3.00   Types: Cigarettes   Quit date: 09/10/1993   Years since quitting: 28.0  Smokeless Tobacco Never    Goals Met:  Independence with exercise equipment Exercise tolerated well No report of concerns or symptoms today Strength training completed today  Goals Unmet:  Not Applicable  Comments: Pt able to follow exercise prescription today without complaint.  Will continue to monitor for progression.    Dr. Emily Filbert is Medical Director for [place].  Dr. Ottie Glazier is Medical Director for [facility] Pulmonary Rehabilitation.

## 2021-09-27 DIAGNOSIS — H5203 Hypermetropia, bilateral: Secondary | ICD-10-CM | POA: Diagnosis not present

## 2021-09-28 ENCOUNTER — Other Ambulatory Visit: Payer: Self-pay

## 2021-09-28 DIAGNOSIS — C50919 Malignant neoplasm of unspecified site of unspecified female breast: Secondary | ICD-10-CM

## 2021-09-28 NOTE — Progress Notes (Signed)
Daily Session Note  Patient Details  Name: Jamie Wu MRN: [id number] Date of Birth: Jan 18, 1971 Referring Provider:   April Manson Cancer Associated Rehabilitation & Exercise from 07/25/2021 in [facility] Cardiac and Pulmonary Rehab  Referring Provider Charlaine Dalton MD       Encounter Date: 09/28/2021  Check In:  Session Check In - 09/28/21 1217       Check-In   Supervising physician immediately available to respond to emergencies See telemetry face sheet for immediately available ER MD    Location [facility]-Cardiac & Pulmonary Rehab    Staff Present Birdie Sons, MPA, Nino Glow, MS, ASCM CEP, Exercise Physiologist;Amanda Oletta Darter, BA, ACSM CEP, Exercise Physiologist    Virtual Visit No    Medication changes reported     No    Fall or balance concerns reported    No    Warm-up and Cool-down Performed on first and last piece of equipment    Resistance Training Performed Yes    VAD Patient? No    PAD/SET Patient? No      Pain Assessment   Currently in Pain? No/denies                Social History   Tobacco Use  Smoking Status Former   Packs/day: 1.00   Years: 3.00   Pack years: 3.00   Types: Cigarettes   Quit date: 09/10/1993   Years since quitting: 28.0  Smokeless Tobacco Never    Goals Met:  Independence with exercise equipment Exercise tolerated well No report of concerns or symptoms today Strength training completed today  Goals Unmet:  Not Applicable  Comments: Pt able to follow exercise prescription today without complaint.  Will continue to monitor for progression.    Dr. Emily Filbert is Medical Director for [place].  Dr. Ottie Glazier is Medical Director for [facility] Pulmonary Rehabilitation.

## 2021-10-03 ENCOUNTER — Other Ambulatory Visit: Payer: Self-pay

## 2021-10-03 DIAGNOSIS — C50919 Malignant neoplasm of unspecified site of unspecified female breast: Secondary | ICD-10-CM

## 2021-10-03 NOTE — Progress Notes (Signed)
Daily Session Note  Patient Details  Name: Jamie Wu MRN: [id number] Date of Birth: [birth year] Referring Provider:   April Manson [facility] Exercise from 07/25/2021 in [facility] Cardiac and Pulmonary Rehab  Referring Provider Charlaine Dalton MD       Encounter Date: 10/03/2021  Check In:  Session Check In - 10/03/21 1222       Check-In   Supervising physician immediately available to respond to emergencies See telemetry face sheet for immediately available ER MD    Location [facility]-Cardiac & Pulmonary Rehab    Staff Present Birdie Sons, MPA, RN;Jessica Clarks, MA, RCEP, CCRP, Marylynn Pearson, MS, ASCM CEP, Exercise Physiologist    Virtual Visit No    Medication changes reported     No    Fall or balance concerns reported    No    Warm-up and Cool-down Performed on first and last piece of equipment    Resistance Training Performed Yes    VAD Patient? No    PAD/SET Patient? No      Pain Assessment   Currently in Pain? No/denies                Social History   Tobacco Use  Smoking Status Former   Packs/day: 1.00   Years: 3.00   Pack years: 3.00   Types: Cigarettes   Quit date: 09/10/1993   Years since quitting: 28.0  Smokeless Tobacco Never    Goals Met:  Independence with exercise equipment Exercise tolerated well No report of concerns or symptoms today Strength training completed today  Goals Unmet:  Not Applicable  Comments: Pt able to follow exercise prescription today without complaint.  Will continue to monitor for progression.    Dr. Emily Filbert is Medical Director for [place].  Dr. Ottie Glazier is Medical Director for [facility] Pulmonary Rehabilitation.

## 2021-10-05 ENCOUNTER — Other Ambulatory Visit: Payer: Self-pay

## 2021-10-05 DIAGNOSIS — C50919 Malignant neoplasm of unspecified site of unspecified female breast: Secondary | ICD-10-CM

## 2021-10-05 NOTE — Progress Notes (Signed)
Daily Session Note  Patient Details  Name: Jamie Wu MRN: [id number] Date of Birth: 08/22/1971 Referring Provider:   April Manson Cancer Associated Rehabilitation & Exercise from 07/25/2021 in [facility] Cardiac and Pulmonary Rehab  Referring Provider Charlaine Dalton MD       Encounter Date: 10/05/2021  Check In:  Session Check In - 10/05/21 1233       Check-In   Supervising physician immediately available to respond to emergencies See telemetry face sheet for immediately available ER MD    Location [facility]-Cardiac & Pulmonary Rehab    Staff Present Coralie Keens, MS, ASCM CEP, Exercise Physiologist;Amanda Oletta Darter, BA, ACSM CEP, Exercise Physiologist    Virtual Visit No    Medication changes reported     No    Fall or balance concerns reported    No    Warm-up and Cool-down Performed on first and last piece of equipment    Resistance Training Performed Yes   yoga   VAD Patient? No                Social History   Tobacco Use  Smoking Status Former   Packs/day: 1.00   Years: 3.00   Pack years: 3.00   Types: Cigarettes   Quit date: 09/10/1993   Years since quitting: 28.0  Smokeless Tobacco Never    Goals Met:  Proper associated with RPD/PD & O2 Sat Exercise tolerated well No report of concerns or symptoms today Strength training completed today  Goals Unmet:  Not Applicable  Comments: Pt able to follow exercise prescription today without complaint.  Will continue to monitor for progression.   Yoga completed today by Exercise Physiologist, Nada Maclachlan.   Dr. Emily Filbert is Medical Director for [place].  Dr. Ottie Glazier is Medical Director for [facility] Pulmonary Rehabilitation.

## 2021-10-09 ENCOUNTER — Other Ambulatory Visit: Payer: Self-pay

## 2021-10-09 ENCOUNTER — Inpatient Hospital Stay: Payer: 59

## 2021-10-09 ENCOUNTER — Encounter: Payer: Self-pay | Admitting: Internal Medicine

## 2021-10-09 ENCOUNTER — Inpatient Hospital Stay (HOSPITAL_BASED_OUTPATIENT_CLINIC_OR_DEPARTMENT_OTHER): Payer: 59 | Admitting: Internal Medicine

## 2021-10-09 ENCOUNTER — Inpatient Hospital Stay: Payer: 59 | Admitting: Pharmacist

## 2021-10-09 VITALS — BP 125/89 | HR 82 | Temp 97.7°F | Ht 68.75 in | Wt 155.2 lb

## 2021-10-09 DIAGNOSIS — Z79811 Long term (current) use of aromatase inhibitors: Secondary | ICD-10-CM | POA: Diagnosis not present

## 2021-10-09 DIAGNOSIS — C50812 Malignant neoplasm of overlapping sites of left female breast: Secondary | ICD-10-CM

## 2021-10-09 DIAGNOSIS — C7951 Secondary malignant neoplasm of bone: Secondary | ICD-10-CM | POA: Diagnosis not present

## 2021-10-09 DIAGNOSIS — Z87891 Personal history of nicotine dependence: Secondary | ICD-10-CM | POA: Diagnosis not present

## 2021-10-09 DIAGNOSIS — M25549 Pain in joints of unspecified hand: Secondary | ICD-10-CM | POA: Diagnosis not present

## 2021-10-09 DIAGNOSIS — Z8616 Personal history of COVID-19: Secondary | ICD-10-CM | POA: Diagnosis not present

## 2021-10-09 DIAGNOSIS — Z170 Estrogen receptor positive status [ER+]: Secondary | ICD-10-CM

## 2021-10-09 DIAGNOSIS — R221 Localized swelling, mass and lump, neck: Secondary | ICD-10-CM | POA: Diagnosis not present

## 2021-10-09 DIAGNOSIS — J90 Pleural effusion, not elsewhere classified: Secondary | ICD-10-CM | POA: Diagnosis not present

## 2021-10-09 DIAGNOSIS — K769 Liver disease, unspecified: Secondary | ICD-10-CM | POA: Diagnosis not present

## 2021-10-09 DIAGNOSIS — C782 Secondary malignant neoplasm of pleura: Secondary | ICD-10-CM | POA: Diagnosis not present

## 2021-10-09 LAB — COMPREHENSIVE METABOLIC PANEL
ALT: 42 U/L (ref 0–44)
AST: 42 U/L — ABNORMAL HIGH (ref 15–41)
Albumin: 4.4 g/dL (ref 3.5–5.0)
Alkaline Phosphatase: 96 U/L (ref 38–126)
Anion gap: 9 (ref 5–15)
BUN: 15 mg/dL (ref 6–20)
CO2: 27 mmol/L (ref 22–32)
Calcium: 9.6 mg/dL (ref 8.9–10.3)
Chloride: 100 mmol/L (ref 98–111)
Creatinine, Ser: 0.89 mg/dL (ref 0.44–1.00)
GFR, Estimated: >60 mL/min (ref >60)
Glucose, Bld: 72 mg/dL (ref 70–99)
Potassium: 3.8 mmol/L (ref 3.5–5.1)
Sodium: 136 mmol/L (ref 135–145)
Total Bilirubin: 1.5 mg/dL — ABNORMAL HIGH (ref 0.3–1.2)
Total Protein: 7.9 g/dL (ref 6.5–8.1)

## 2021-10-09 LAB — CBC WITH DIFFERENTIAL/PLATELET
Abs Immature Granulocytes: 0.01 10*3/uL (ref 0.00–0.07)
Basophils Absolute: 0.1 10*3/uL (ref 0.0–0.1)
Basophils Relative: 4 %
Eosinophils Absolute: 0.1 10*3/uL (ref 0.0–0.5)
Eosinophils Relative: 4 %
HCT: 39.8 % (ref 36.0–46.0)
Hemoglobin: 14.2 g/dL (ref 12.0–15.0)
Immature Granulocytes: 1 %
Lymphocytes Relative: 31 %
Lymphs Abs: 0.6 10*3/uL — ABNORMAL LOW (ref 0.7–4.0)
MCH: 34 pg (ref 26.0–34.0)
MCHC: 35.7 g/dL (ref 30.0–36.0)
MCV: 95.2 fL (ref 80.0–100.0)
Monocytes Absolute: 0.3 10*3/uL (ref 0.1–1.0)
Monocytes Relative: 14 %
Neutro Abs: 1 10*3/uL — ABNORMAL LOW (ref 1.7–7.7)
Neutrophils Relative %: 46 %
Platelets: 184 10*3/uL (ref 150–400)
RBC: 4.18 MIL/uL (ref 3.87–5.11)
RDW: 13.2 % (ref 11.5–15.5)
WBC: 2 10*3/uL — ABNORMAL LOW (ref 4.0–10.5)
nRBC: 0 % (ref 0.0–0.2)

## 2021-10-09 LAB — VITAMIN D 25 HYDROXY (VIT D DEFICIENCY, FRACTURES): Vit D, 25-Hydroxy: 35.22 ng/mL (ref 30–100)

## 2021-10-09 MED ORDER — PREDNISONE 50 MG PO TABS: ORAL_TABLET | ORAL | 4 refills | Status: DC

## 2021-10-09 NOTE — Assessment & Plan Note (Addendum)
#  Recurrent stage IV ER positive HER2 negative breast cancer-currently on Femara plus on ribociclib - [3 weeks on 1 week off] DEC 4th, 2022- CT CAP-shows improvement of liver lesions; stable small sclerotic bone lesions; improvement of the interstitial pattern of the right lung opacities; small left-sided pleural effusion stable large to moderate-sized right-sided pleural effusion.  Currently on Ribocilib +Femara.  December 2022 tumor markers improving.  # Proceed with cycle #6 of Ribociclib + Femara-tolerating well. Labs today reviewed;  acceptable for treatment today [ANC-1.0].  Discussed with Jamie Wu.  We will plan to get imaging again in 1 month.  # Left sided neck LN-imaging.  None currently.  Check CT scan-   # Hand joint pains-likely secondary to Femara. -Improved--on calcium plus vitamin D- monitor for now. .   # Right > left pleural effusion-left-sided Pleurx cath thoracoscopy [followed by Dr.Wahidi in [place].  Left chest wall pain-?  Scar tissue/Pleurx catheter rather than true malignancy- S/p explantation- in NOV 2022.  Markers were normal.  # Sclerotic bone lesions-metastatic disease; Declined bisphosphonate therapy- continues to monitor for repeat imaging at this time.   dye prep- # DISPOSITION: # needs dye prep- # follow up in 4 weeks-./Monday MD; labs- cbc/cmp; ca 27-29; CEA; Ca15-3; ; CT CAP--Dr.B

## 2021-10-09 NOTE — Progress Notes (Signed)
Having pain in the lt breast/chest area 3/10. Mainly when laying down.  Pt states she still has swollen lymph node left neck.  Plurex cath. Removed 09/20/2021.

## 2021-10-09 NOTE — Progress Notes (Signed)
[place] OFFICE PROGRESS NOTE  Patient Care Team: Rusty Aus, [name], [name], [name], [name], MD (Inactive) as Consulting Physician (Family Medicine) Lloyd Huger, MD as Consulting Physician (Hematology and Oncology)   Cancer Staging  Carcinoma of overlapping sites of left breast in female, estrogen receptor positive [facility]) Staging form: Breast, AJCC 8th Edition - Clinical stage from 05/11/2021: Stage IV (rcTX, [name], pM1, [name], [name], [name], MD on 05/11/2021 Stage prefix: Recurrence    Oncology History Overview Note  # 2008- Left breast cancer [s/p Lumpec]; [name]; Tamoxifen x4 years   # DEC 2021/Jan 2022- cough/COVID [out pt]   #  Left breast cancer ([name]/PR+) dx in 2008; [name], [name], tamoxifen x 5 years, 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     % of Cells Staining Intensity Score Interpretation  ER IHC 73 2+ POSITIVE  PR IHC 4 3+ LOW POSITIVE  HER2/neu IHC 28 2+ EQUIVOCAL    HER2/NEU FISH RESULTS WILL BE ISSUED LATER IN THIS REPORT.  Electronically signed by FElspeth Cho MD on 05/04/2021 at  2:47 PM  Addendum    INTERPRETATION    HER2/Neu Gene Copy Number Centromere 17 ([id number]) Gene Copy Number HER2/Neu to CEP17 Ratio Interpretation  HER2/Neu FISH  2.4 1.6 1.65 NOT AMPLIFIED     CT- SEP 2022-Numerous new hepatic lesions compatible with metastatic disease, [name], [name], [name], [name], not associated with overt nodularity.   Improvement with respect to ground-glass and septal thickening in the RIGHT chest. Correlate with any ongoing respiratory symptoms.   Scattered sclerotic lesions with similar appearance seen throughout the spine and in the pelvis and LEFT proximal femur --------------------------  # Left pleurex catheter- OUT NOV 2022 [Dr.Wahidi; [facility]; pul]   Carcinoma of overlapping sites of left breast in female, estrogen receptor positive ([facility]  05/11/2021 Initial Diagnosis   Carcinoma of left breast in female, estrogen receptor positive ([facility]   05/11/2021 Cancer Staging   Staging form: Breast, AJCC 8th Edition - Clinical stage from 05/11/2021: Stage IV (rcTX, [name], pM1, [name], [name], HER2-) - Signed by FLloyd Huger MD on 05/11/2021 Stage prefix: Recurrence      INTERVAL HISTORY: Ambulating independently.  Alone.   ADaisy [id number].[id number].  female  pleasant patient above history of + metastatic recurrent ER/PR positive breast cancer is on letrozole plus ribociclib is here for follow-up.  In the interim patient had a Pleurx catheter taken out.  Denies any worsening shortness of breath or  cough.   No dyspnea.  Patient notes to have improvement of energy levels.  She continues to complain of intermittent swelling in the neck.  Joint pains improved.  Review of Systems  Constitutional:  Positive for malaise/fatigue and weight loss. Negative for chills, [name], sputum production and wheezing.   Cardiovascular:  Negative for chest pain, palpitations, orthopnea and leg swelling.  Gastrointestinal:  Negative for abdominal pain, blood in stool, constipation, diarrhea, heartburn, melena, nausea and vomiting.  Genitourinary:  Negative for dysuria, frequency and urgency.  Musculoskeletal:  Positive for joint pain. Negative for back pain.  Skin: Negative.  Negative for itching and rash.  Neurological:  Negative for dizziness, tingling, focal weakness, weakness and headaches.  Endo/Heme/Allergies:  Does not bruise/bleed easily.  Psychiatric/Behavioral:  Negative for depression. The patient is not nervous/anxious and does not have insomnia.      PAST MEDICAL HISTORY :  Past Medical History:  Diagnosis Date   Breast cancer ([place]) 2008   left breast   Personal history of malignant neoplasm of breast 2008   L LUMPECTOMY   Personal history of radiation therapy 2008   LEFT lumpectomy 2008    PAST SURGICAL HISTORY :   Past Surgical History:  Procedure Laterality Date   BREAST BIOPSY Right June 25, 2012   MRI abnormality showing fibrocystic changes with microcalcifications.   BREAST EXCISIONAL BIOPSY Left 2008   breast ca   BREAST LUMPECTOMY Left 2008   CHOLECYSTECTOMY  1998   COLONOSCOPY WITH PROPOFOL N/A 07/29/2020   Procedure: COLONOSCOPY WITH PROPOFOL;  [name], MD;  [name];  Service: Endoscopy;  [name];    FAMILY HISTORY :   Family History  Problem Relation Age of Onset   Colon cancer  Paternal Uncle        lat3 29's    Breast cancer Neg Hx     SOCIAL HISTORY:   Social History   Tobacco Use   Smoking status: Former    Packs/day: 1.00    Years: 3.00    Pack years: 3.00    Types: Cigarettes    Quit date: 09/10/1993    Years since quitting: 28.0   Smokeless tobacco: Never  Vaping Use   Vaping Use: Never used  Substance Use Topics   Alcohol use: Yes    Alcohol/week: 0.0 standard drinks    Comment: occas-wine   Drug use: No    ALLERGIES:  is allergic to contrast media [iodinated contrast media] and sulfa antibiotics.  MEDICATIONS:  Current Outpatient Medications  Medication Sig Dispense Refill   famotidine (PEPCID) 20 MG tablet Take 20 mg by mouth as needed.     letrozole (FEMARA) 2.5 MG tablet Take 1 tablet (2.5 mg total) by mouth daily. 90 tablet 1   predniSONE (DELTASONE) 50 MG tablet Take 1 pill 50 mg orally 13 hours; 1 pill 7 hours and 1 hours prior to scan. 3 tablet 4   ribociclib succ (KISQALI, 600 MG DOSE,) 200 MG Therapy Pack Take 3 tablets (600 mg total) by mouth daily. Take for 21 days on, 7 days off, repeat every  28 days. 63 tablet 2   montelukast (SINGULAIR) 10 MG tablet Take one pill 24 hours; and 2 hours prior to- IV dye/CT scan- (Patient not taking: Reported on 07/25/2021) 10 tablet 1   Polyethylene Glycol 3350 (MIRALAX PO) Take by mouth. Patient taking as needed for constipation     No current facility-administered medications for this visit.    PHYSICAL EXAMINATION: ECOG PERFORMANCE STATUS: 1 - Symptomatic but completely ambulatory  BP 125/89 (BP Location: Right Arm, Patient Position: Sitting, Cuff Size: Normal)    Pulse 82    Temp 97.7 F (36.5 C) (Tympanic)    Ht 5' 8.75" (1.746 m)    Wt 155 lb 3.2 oz (70.4 kg)    SpO2 100%    BMI 23.09 kg/m   Filed Weights   10/09/21 0925  Weight: 155 lb 3.2 oz (70.4 kg)    Physical Exam Vitals and nursing note reviewed.  HENT:     Head: Normocephalic and atraumatic.     Mouth/Throat:      Pharynx: Oropharynx is clear.  Eyes:     Extraocular Movements: Extraocular movements intact.     Pupils: Pupils are equal, round, and reactive to light.  Cardiovascular:     Rate and Rhythm: Normal rate and regular rhythm.  Pulmonary:     Comments: Decreased breath sounds bilaterally.  Abdominal:     Palpations: Abdomen is soft.  Musculoskeletal:        General: Normal range of motion.     Cervical back: Normal range of motion.  Skin:    General: Skin is warm.  Neurological:     General: No focal deficit present.     Mental Status: She is alert and oriented to person, place, and time.  Psychiatric:        Behavior: Behavior normal.        Judgment: Judgment normal.       LABORATORY DATA:  I have reviewed the data as listed    Component Value Date/Time   NA 136 10/09/2021 0903   K 3.8 10/09/2021 0903   CL 100 10/09/2021 0903   CO2 27 10/09/2021 0903   GLUCOSE 72 10/09/2021 0903   BUN 15 10/09/2021 0903   CREATININE 0.89 10/09/2021 0903   CALCIUM 9.6 10/09/2021 0903   PROT 7.9 10/09/2021 0903   ALBUMIN 4.4 10/09/2021 0903   AST 42 (H) 10/09/2021 0903   ALT 42 10/09/2021 0903   ALKPHOS 96 10/09/2021 0903   BILITOT 1.5 (H) 10/09/2021 0903   GFRNONAA >60 10/09/2021 0903   GFRAA >60 05/02/2017 0936    No results found for: SPEP, UPEP  Lab Results  Component Value Date   WBC 2.0 (L) 10/09/2021   NEUTROABS 1.0 (L) 10/09/2021   HGB 14.2 10/09/2021   HCT 39.8 10/09/2021   MCV 95.2 10/09/2021   PLT 184 10/09/2021      Chemistry      Component Value Date/Time   NA 136 10/09/2021 0903   K 3.8 10/09/2021 0903   CL 100 10/09/2021 0903   CO2 27 10/09/2021 0903   BUN 15 10/09/2021 0903   CREATININE 0.89 10/09/2021 0903      Component Value Date/Time   CALCIUM 9.6 10/09/2021 0903   ALKPHOS 96 10/09/2021 0903   AST 42 (H) 10/09/2021 0903   ALT 42 10/09/2021 0903   BILITOT 1.5 (H) 10/09/2021 0903       RADIOGRAPHIC STUDIES: I have personally reviewed the  radiological images as listed and agreed  with the findings in the report. No results found.   ASSESSMENT & PLAN:  Carcinoma of overlapping sites of left breast in female, estrogen receptor positive ([place]) #Recurrent stage IV ER positive HER2 negative breast cancer-currently on Femara plus on ribociclib - [3 weeks on 1 week off] DEC 4th, 2022- CT CAP-shows improvement of liver lesions; stable small sclerotic bone lesions; improvement of the interstitial pattern of the right lung opacities; small left-sided pleural effusion stable large to moderate-sized right-sided pleural effusion.  Currently on Ribocilib +Femara.  December 2022 tumor markers improving.  # Proceed with cycle #6 of Ribociclib + Femara-tolerating well. Labs today reviewed;  acceptable for treatment today [ANC-1.0].  Discussed with Jamie Wu.  We will plan to get imaging again in 1 month.  # Left sided neck LN-imaging.  None currently.  Check CT scan-   # Hand joint pains-likely secondary to Femara. -Improved--on calcium plus vitamin D- monitor for now. .   # Right > left pleural effusion-left-sided Pleurx cath thoracoscopy [followed by Dr.Wahidi in [place].  Left chest wall pain-?  Scar tissue/Pleurx catheter rather than true malignancy- S/p explantation- in NOV 2022.  Markers were normal.  # Sclerotic bone lesions-metastatic disease; Declined bisphosphonate therapy- continues to monitor for repeat imaging at this time.   dye prep- # DISPOSITION: # needs dye prep- # follow up in 4 weeks-./Monday MD; labs- cbc/cmp; ca 27-29; CEA; Ca15-3; ; CT CAP--Dr.B      Orders Placed This Encounter  Procedures   CT CHEST ABDOMEN PELVIS W CONTRAST    Standing Status:   Future    Standing Expiration Date:   10/09/2022    Order Specific Question:   Preferred imaging location?    Answer:   [facility]    Order Specific Question:   Radiology Contrast Protocol - do NOT remove file path    Answer:    [email or web address]    Order Specific Question:   Is patient pregnant?    Answer:   No   CT SOFT TISSUE NECK W CONTRAST    Standing Status:   Future    Standing Expiration Date:   10/09/2022    Order Specific Question:   If indicated for the ordered procedure, I authorize the administration of contrast media per Radiology protocol    Answer:   Yes    Order Specific Question:   Preferred imaging location?    Answer:   [facility]    Order Specific Question:   Is patient pregnant?    Answer:   No   CBC with Differential/Platelet    Standing Status:   Future    Standing Expiration Date:   10/09/2022   Comprehensive metabolic panel    Standing Status:   Future    Standing Expiration Date:   10/09/2022   Cancer antigen 27.29    Standing Status:   Future    Standing Expiration Date:   10/09/2022   CEA    Standing Status:   Future    Standing Expiration Date:   10/09/2022   Cancer antigen 15-3    Standing Status:   Future    Standing Expiration Date:   10/09/2022    All questions were answered. The patient knows to call the clinic with any problems, questions or concerns.      Cammie Sickle, MD 10/09/2021 12:23 PM

## 2021-10-09 NOTE — Progress Notes (Signed)
Contrast allergy dye prep rx faxed to [facility]

## 2021-10-09 NOTE — Progress Notes (Signed)
[place]  [phone] [phone]  Patient Care Team: Rusty Aus, [name], [name], [name], [name], MD (Inactive) as Consulting Physician (Family Medicine) Lloyd Huger, MD as Consulting Physician (Hematology and Oncology)   Name of the patient: Jamie Wu  [id number]  [birth year]   Date of visit: 10/09/21  HPI: Patient is a 51 y.o. female with recurrent metastatic breast cancer. Currently treated with Kisqali (ribociclib) and letrozole. Started ribociclib on 05/22/21. She started her letrozole in 04/2021.  Reason for Consult: Oral chemotherapy follow-up for ribociclib therapy.   PAST MEDICAL HISTORY: Past Medical History:  Diagnosis Date   Breast cancer ([facility]) 2008   left breast   Personal history of malignant neoplasm of breast 2008   L LUMPECTOMY   Personal history of radiation therapy 2008   LEFT lumpectomy 2008    HEMATOLOGY/ONCOLOGY HISTORY:  Oncology History Overview Note  # 2008- Left breast cancer [s/p Lumpec]; [name]; [name]; [name], [name], tamoxifen x 5 years, 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     % of Cells Staining Intensity Score Interpretation  ER IHC 73 2+ POSITIVE  PR IHC 4 3+ LOW POSITIVE  HER2/neu IHC 28 2+ EQUIVOCAL    HER2/NEU FISH RESULTS WILL BE ISSUED LATER IN THIS REPORT.  Electronically signed by FElspeth Cho MD on 05/04/2021 at  2:47 PM  Addendum    INTERPRETATION   HER2/Neu Gene Copy Number Centromere 17 ([id number]) Gene Copy Number HER2/Neu to CEP17 Ratio Interpretation  HER2/Neu FISH  2.4 1.6 1.65 NOT AMPLIFIED     CT- SEP 2022-Numerous new hepatic lesions compatible with metastatic disease, [name], [name], [name], [name], [name], estrogen receptor positive ([facility]  05/11/2021 Initial Diagnosis   Carcinoma of left breast in female, estrogen receptor positive ([facility]  05/11/2021 Cancer Staging   Staging form: Breast, AJCC 8th Edition - Clinical stage from 05/11/2021: Stage IV (rcTX, [name], pM1, [name], [name], [name], MD on 05/11/2021 Stage prefix:  Recurrence      ALLERGIES:  is allergic to contrast media [iodinated contrast media] and sulfa antibiotics.  MEDICATIONS:  Current Outpatient Medications  Medication Sig Dispense Refill   famotidine (PEPCID) 20 MG tablet Take 20 mg by mouth once.     letrozole (FEMARA) 2.5 MG tablet Take 1 tablet (2.5 mg total) by mouth daily. 90 tablet 1   montelukast (SINGULAIR) 10 MG tablet Take one pill 24 hours; and 2 hours prior to- IV dye/CT scan- (Patient not taking: Reported on 07/25/2021) 10 tablet 1   predniSONE (DELTASONE) 50 MG tablet Take 1 pill 50 mg orally 13 hours; 1 pill 7 hours and 1 hours prior to scan. 3 tablet 4   ribociclib succ (KISQALI, 600 MG DOSE,) 200 MG Therapy Pack Take 3 tablets (600 mg total) by mouth daily. Take for 21 days on, 7 days off, repeat every 28 days. 63 tablet 2   No current facility-administered medications for this visit.    VITAL SIGNS: There were no vitals taken for this visit. There were no vitals filed for this visit.  Estimated body mass index is 23.09 kg/m as calculated from the following:   Height as of an earlier encounter on 10/09/21: 5' 8.75" (1.746 m).   Weight as of an earlier encounter on 10/09/21: 70.4 kg (155 lb 3.2 oz).  LABS: CBC:    Component Value Date/Time   WBC 2.0 (L) 10/09/2021 0903   HGB 14.2 10/09/2021 0903   HCT 39.8 10/09/2021 0903   PLT 184 10/09/2021 0903   MCV 95.2 10/09/2021 0903   NEUTROABS 1.0 (L) 10/09/2021 0903   LYMPHSABS 0.6 (L) 10/09/2021 0903   MONOABS 0.3 10/09/2021 0903   EOSABS 0.1 10/09/2021 0903   BASOSABS 0.1 10/09/2021 0903   Comprehensive Metabolic Panel:    Component Value Date/Time   NA 136 10/09/2021 0903   K 3.8 10/09/2021 0903   CL 100 10/09/2021 0903   CO2 27 10/09/2021 0903   BUN 15 10/09/2021 0903   CREATININE 0.89 10/09/2021 0903   GLUCOSE 72 10/09/2021 0903   CALCIUM 9.6 10/09/2021 0903   AST 42 (H) 10/09/2021 0903   ALT 42 10/09/2021 0903   ALKPHOS 96 10/09/2021 0903   BILITOT  1.5 (H) 10/09/2021 0903   PROT 7.9 10/09/2021 0903   ALBUMIN 4.4 10/09/2021 0903     Present during today's visit: Patient only   Assessment and Plan: Reviewed CMP and CBC, continue ribociclib 600 mg 21 days on/7 days off.   Patient's Pleurex catheter has been removed. Patient not reporting any issues or changes in respiratory symptoms since removal.   Oral Chemotherapy Side Effect/Intolerance:  No reported fatigue, constipation, diarrhea, or nausea Fatigue: patient has noticed that this cycle she has not been fatigued or hit "a wall" as she had with previous cycles. She is exercising ~5 times per week.  Constipation: intermittent. She is able to manage this effectively.  Other:  Neck swelling: patient noticed swelling in neck over the last cycle. Reports improvement with ibuprofen. Plan per medical team to include neck in next imaging. Patient knows to report if this occurs again. Sexual activity: Patient reports dryness and vaginal bleeding with sexual intercourse. Will refer patient to Beckey Rutter, NP for counseling.   Oral Chemotherapy Adherence: No reported  missed doses. No patient barriers to medication adherence identified.   New medications: None reported.  Medication Access Issues: [facility] with no issues  Patient expressed understanding and was in agreement with this plan. She also understands that She can call clinic at any time with any questions, concerns, or complaints.   Follow-up plan: RTC in 4 weeks  Thank you for allowing me to participate in the care of this very pleasant patient.   Time Total: 20 minutes  Visit consisted of counseling and education on dealing with issues of symptom management in the setting of serious and potentially life-threatening illness.Greater than 50%  of this time was spent counseling and coordinating care related to the above assessment and plan.  Visit conducted by: Mickeal Skinner, PharmD PGY1 Pharmacy  Resident  Signed by (present and participated in visit):  Darl Pikes, PharmD, BCPS, BCOP, CPP Hematology/Oncology Clinical Pharmacist Practitioner [place]/DB/AP Oral [facility] [phone]  10/09/2021 10:40 AM

## 2021-10-10 DIAGNOSIS — C50919 Malignant neoplasm of unspecified site of unspecified female breast: Secondary | ICD-10-CM

## 2021-10-10 LAB — CEA: CEA: 12.8 ng/mL — ABNORMAL HIGH (ref 0.0–4.7)

## 2021-10-10 LAB — CANCER ANTIGEN 15-3: CA 15-3: 136 U/mL — ABNORMAL HIGH (ref 0.0–25.0)

## 2021-10-10 LAB — CANCER ANTIGEN 27.29: CA 27.29: 164.5 U/mL — ABNORMAL HIGH (ref 0.0–38.6)

## 2021-10-10 NOTE — Progress Notes (Signed)
Daily Session Note  Patient Details  Name: Jamie Wu MRN: [id number] Date of Birth: [birth year] Referring Provider:   April Manson Cancer Associated Rehabilitation & Exercise from 07/25/2021 in [facility] Cardiac and Pulmonary Rehab  Referring Provider Charlaine Dalton MD       Encounter Date: 10/10/2021  Check In:  Session Check In - 10/10/21 1216       Check-In   Supervising physician immediately available to respond to emergencies See telemetry face sheet for immediately available ER MD    Location [facility]-Cardiac & Pulmonary Rehab    Staff Present Birdie Sons, MPA, RN;Jessica Pe Ell, MA, RCEP, CCRP, Marylynn Pearson, MS, ASCM CEP, Exercise Physiologist    Virtual Visit No    Medication changes reported     No    Fall or balance concerns reported    No    Warm-up and Cool-down Performed on first and last piece of equipment    Resistance Training Performed Yes    VAD Patient? No    PAD/SET Patient? No      Pain Assessment   Currently in Pain? No/denies                Social History   Tobacco Use  Smoking Status Former   Packs/day: 1.00   Years: 3.00   Pack years: 3.00   Types: Cigarettes   Quit date: 09/10/1993   Years since quitting: 28.1  Smokeless Tobacco Never    Goals Met:  Independence with exercise equipment Exercise tolerated well No report of concerns or symptoms today Strength training completed today  Goals Unmet:  Not Applicable  Comments: Pt able to follow exercise prescription today without complaint.  Will continue to monitor for progression.    Dr. Emily Filbert is Medical Director for [place].  Dr. Ottie Glazier is Medical Director for [facility] Pulmonary Rehabilitation.

## 2021-10-11 ENCOUNTER — Ambulatory Visit: Payer: 59 | Admitting: Psychologist

## 2021-10-12 ENCOUNTER — Other Ambulatory Visit: Payer: Self-pay

## 2021-10-12 ENCOUNTER — Encounter: Payer: 59 | Attending: Internal Medicine

## 2021-10-12 DIAGNOSIS — C50919 Malignant neoplasm of unspecified site of unspecified female breast: Secondary | ICD-10-CM | POA: Insufficient documentation

## 2021-10-12 NOTE — Progress Notes (Signed)
Daily Session Note  Patient Details  Name: Jamie Wu MRN: [id number] Date of Birth: 1970/12/27 Referring Provider:   April Manson Cancer Associated Rehabilitation & Exercise from 07/25/2021 in [facility] Cardiac and Pulmonary Rehab  Referring Provider Charlaine Dalton MD       Encounter Date: 10/12/2021  Check In:  Session Check In - 10/12/21 1221       Check-In   Supervising physician immediately available to respond to emergencies See telemetry face sheet for immediately available ER MD    Location [facility]-Cardiac & Pulmonary Rehab    Staff Present Birdie Sons, MPA, Elveria Rising, BA, ACSM CEP, Exercise Physiologist;Kara Eliezer Bottom, MS, ASCM CEP, Exercise Physiologist    Virtual Visit No    Medication changes reported     No    Fall or balance concerns reported    No    Warm-up and Cool-down Performed on first and last piece of equipment    Resistance Training Performed Yes    VAD Patient? No    PAD/SET Patient? No      Pain Assessment   Currently in Pain? No/denies                Social History   Tobacco Use  Smoking Status Former   Packs/day: 1.00   Years: 3.00   Pack years: 3.00   Types: Cigarettes   Quit date: 09/10/1993   Years since quitting: 28.1  Smokeless Tobacco Never    Goals Met:  Independence with exercise equipment Exercise tolerated well No report of concerns or symptoms today Strength training completed today  Goals Unmet:  Not Applicable  Comments: Pt able to follow exercise prescription today without complaint.  Will continue to monitor for progression.    Dr. Emily Filbert is Medical Director for [place].  Dr. Ottie Glazier is Medical Director for [facility] Pulmonary Rehabilitation.

## 2021-10-13 ENCOUNTER — Inpatient Hospital Stay (HOSPITAL_BASED_OUTPATIENT_CLINIC_OR_DEPARTMENT_OTHER): Payer: 59 | Admitting: Nurse Practitioner

## 2021-10-17 ENCOUNTER — Other Ambulatory Visit: Payer: Self-pay

## 2021-10-17 ENCOUNTER — Encounter: Payer: 59 | Admitting: *Deleted

## 2021-10-17 VITALS — Ht 68.75 in | Wt 154.0 lb

## 2021-10-17 DIAGNOSIS — C50919 Malignant neoplasm of unspecified site of unspecified female breast: Secondary | ICD-10-CM

## 2021-10-17 NOTE — Progress Notes (Signed)
Daily Session Note  Patient Details  Name: Jamie Wu MRN: [id number] Date of Birth: [birth year] Referring Provider:   April Manson Cancer Associated Rehabilitation & Exercise from 07/25/2021 in [facility] Cardiac and Pulmonary Rehab  Referring Provider Charlaine Dalton MD       Encounter Date: 10/17/2021  Check In:  Session Check In - 10/17/21 1219       Check-In   Supervising physician immediately available to respond to emergencies See telemetry face sheet for immediately available ER MD    Location [facility]-Cardiac & Pulmonary Rehab    Staff Present Alberteen Sam, MA, RCEP, CCRP, Marylynn Pearson, MS, ASCM CEP, Exercise Physiologist    Virtual Visit No    Medication changes reported     No    Fall or balance concerns reported    No    Warm-up and Cool-down Performed on first and last piece of equipment    Resistance Training Performed Yes    VAD Patient? No    PAD/SET Patient? No      Pain Assessment   Currently in Pain? No/denies                Social History   Tobacco Use  Smoking Status Former   Packs/day: 1.00   Years: 3.00   Pack years: 3.00   Types: Cigarettes   Quit date: 09/10/1993   Years since quitting: 28.1  Smokeless Tobacco Never    Goals Met:  Proper associated with RPD/PD & O2 Sat Independence with exercise equipment Exercise tolerated well No report of concerns or symptoms today Strength training completed today  Goals Unmet:  Not Applicable  Comments: Pt able to follow exercise prescription today without complaint.  Will continue to monitor for progression.    Dr. Emily Filbert is Medical Director for [place].  Dr. Ottie Glazier is Medical Director for [facility] Pulmonary Rehabilitation.

## 2021-10-17 NOTE — Progress Notes (Signed)
Appt cancelled

## 2021-10-19 ENCOUNTER — Other Ambulatory Visit: Payer: Self-pay

## 2021-10-19 DIAGNOSIS — C50919 Malignant neoplasm of unspecified site of unspecified female breast: Secondary | ICD-10-CM

## 2021-10-19 NOTE — Progress Notes (Signed)
Daily Session Note  Patient Details  Name: Jamie Wu MRN: [id number] Date of Birth: 1971-06-03 Referring Provider:   April Manson Cancer Associated Rehabilitation & Exercise from 07/25/2021 in [facility] Cardiac and Pulmonary Rehab  Referring Provider Charlaine Dalton MD       Encounter Date: 10/19/2021  Check In:  Session Check In - 10/19/21 1208       Check-In   Supervising physician immediately available to respond to emergencies See telemetry face sheet for immediately available ER MD    Location [facility]-Cardiac & Pulmonary Rehab    Staff Present Birdie Sons, MPA, Nino Glow, MS, ASCM CEP, Exercise Physiologist;Amanda Oletta Darter, BA, ACSM CEP, Exercise Physiologist    Virtual Visit No    Medication changes reported     No    Fall or balance concerns reported    No    Warm-up and Cool-down Performed on first and last piece of equipment    Resistance Training Performed Yes    VAD Patient? No    PAD/SET Patient? No      Pain Assessment   Currently in Pain? No/denies                Social History   Tobacco Use  Smoking Status Former   Packs/day: 1.00   Years: 3.00   Pack years: 3.00   Types: Cigarettes   Quit date: 09/10/1993   Years since quitting: 28.1  Smokeless Tobacco Never    Goals Met:  Independence with exercise equipment Exercise tolerated well No report of concerns or symptoms today Strength training completed today  Goals Unmet:  Not Applicable  Comments: Pt able to follow exercise prescription today without complaint.  Will continue to monitor for progression.    Dr. Emily Filbert is Medical Director for [place].  Dr. Ottie Glazier is Medical Director for [facility] Pulmonary Rehabilitation.

## 2021-10-24 ENCOUNTER — Other Ambulatory Visit: Payer: Self-pay

## 2021-10-24 VITALS — Ht 68.75 in | Wt 153.7 lb

## 2021-10-24 DIAGNOSIS — C50919 Malignant neoplasm of unspecified site of unspecified female breast: Secondary | ICD-10-CM

## 2021-10-24 NOTE — Progress Notes (Signed)
Daily Session Note  Patient Details  Name: Jamie Wu MRN: [id number] Date of Birth: Aug 30, 1971 Referring Provider:   April Manson Cancer Associated Rehabilitation & Exercise from 07/25/2021 in [facility] Cardiac and Pulmonary Rehab  Referring Provider Charlaine Dalton MD       Encounter Date: 10/24/2021  Check In:  Session Check In - 10/24/21 1237       Check-In   Supervising physician immediately available to respond to emergencies See telemetry face sheet for immediately available ER MD    Location [facility]-Cardiac & Pulmonary Rehab    Staff Present Alberteen Sam, MA, RCEP, CCRP, Marylynn Pearson, MS, ASCM CEP, Exercise Physiologist    Virtual Visit No    Medication changes reported     No    Fall or balance concerns reported    No    Warm-up and Cool-down Performed on first and last piece of equipment    Resistance Training Performed Yes    VAD Patient? No    PAD/SET Patient? No      Pain Assessment   Currently in Pain? No/denies                Social History   Tobacco Use  Smoking Status Former   Packs/day: 1.00   Years: 3.00   Pack years: 3.00   Types: Cigarettes   Quit date: 09/10/1993   Years since quitting: 28.1  Smokeless Tobacco Never    Goals Met:  Proper associated with RPD/PD & O2 Sat Independence with exercise equipment Exercise tolerated well No report of concerns or symptoms today Strength training completed today  Goals Unmet:  Not Applicable  Comments: Pt able to follow exercise prescription today without complaint.  Will continue to monitor for progression.    Dr. Emily Filbert is Medical Director for [place].  Dr. Ottie Glazier is Medical Director for [facility] Pulmonary Rehabilitation.

## 2021-10-24 NOTE — Patient Instructions (Signed)
Discharge Patient Instructions  Patient Details  Name: Jamie Wu MRN: [id number] Date of Birth: 01-29-1971 Referring Provider:  Rusty Aus, MD   Number of Visits: 24  Reason for Discharge:  Patient reached a stable level of exercise. Patient independent in their exercise. Patient has met program and personal goals.  Smoking History:  Social History   Tobacco Use  Smoking Status Former   Packs/day: 1.00   Years: 3.00   Pack years: 3.00   Types: Cigarettes   Quit date: 09/10/1993   Years since quitting: 28.1  Smokeless Tobacco Never    Diagnosis:  Malignant neoplasm of female breast, unspecified estrogen receptor status, unspecified laterality, unspecified site of breast ([place])  Initial Exercise Prescription:  Initial Exercise Prescription - 07/25/21 1400       Date of Initial Exercise RX and Referring Provider   Date 07/25/21    Referring Provider Charlaine Dalton MD      Treadmill   MPH 2.7    Grade 0    Minutes 15    METs 3.07      Recumbant Bike   Level 1    RPM 60    Minutes 15    METs 4.7      NuStep   Level 1    SPM 80    Minutes 15    METs 4.7      REL-XR   Level 1    Speed 50    Minutes 15    METs 4.7      Prescription Details   Frequency (times per week) 2    Duration Progress to 30 minutes of continuous aerobic without signs/symptoms of physical distress      Intensity   THRR 40-80% of Max Heartrate 114-150    Ratings of Perceived Exertion 11-13    Perceived Dyspnea 0-4      Progression   Progression Continue to progress workloads to maintain intensity without signs/symptoms of physical distress.      Resistance Training   Training Prescription Yes    Weight 3 lb    Reps 10-15             Discharge Exercise Prescription (Final Exercise Prescription Changes):  Exercise Prescription Changes - 10/24/21 1400       Response to Exercise   Blood Pressure (Admit) 114/70    Blood Pressure (Exercise) 124/76    Blood  Pressure (Exit) 102/60    Heart Rate (Admit) 92 bpm    Heart Rate (Exercise) 112 bpm    Heart Rate (Exit) 98 bpm    Oxygen Saturation (Admit) 96 %    Oxygen Saturation (Exercise) 95 %    Oxygen Saturation (Exit) 97 %    Rating of Perceived Exertion (Exercise) 13    Perceived Dyspnea (Exercise) 1    Duration Continue with 30 min of aerobic exercise without signs/symptoms of physical distress.    Intensity THRR unchanged      Progression   Progression Continue to progress workloads to maintain intensity without signs/symptoms of physical distress.    Average METs 3.87      Resistance Training   Training Prescription Yes    Weight 5 lb    Reps 10-15      Interval Training   Interval Training No      Treadmill   MPH 4    Grade 0    Minutes 15    METs 4.06      Recumbant Bike   Level  5    RPM 57    Minutes 15    METs 4.04      Elliptical   Level 1    Speed 3.6    Minutes 15      REL-XR   Level 5    Minutes 15    METs 4.4      Biostep-RELP   Level 2    Minutes 15    METs 3             Functional Capacity:  6 Minute Walk     Row Name 07/25/21 1418 10/17/21 1410       6 Minute Walk   Phase Initial Discharge    Distance 1460 feet 1960 feet    Distance % Change -- 34.2 %    Distance Feet Change -- 500 ft    Walk Time 6 minutes 6 minutes    # of Rest Breaks 0 0    MPH 2.76 3.71    METS 4.74 5.66    RPE 10 13    Perceived Dyspnea  0 0    VO2 Peak 16.6 19.81    Symptoms No No    Resting HR 78 bpm 83 bpm    Resting BP 118/74 116/60    Resting Oxygen Saturation  97 % 99 %    Exercise Oxygen Saturation  during 6 min walk 93 % 96 %    Max Ex. HR 110 bpm 116 bpm    Max Ex. BP 140/76 144/68    2 Minute Post BP 130/76 --             Nutrition & Weight - Outcomes:  Pre Biometrics - 07/25/21 1420       Pre Biometrics   Height 5' 8.75" (1.746 m)    Weight 148 lb 9.6 oz (67.4 kg)    BMI (Calculated) 22.11             Post Biometrics -  10/24/21 1439        Post  Biometrics   Height 5' 8.75" (1.746 m)    Weight 153 lb 11.2 oz (69.7 kg)    BMI (Calculated) 22.87              Goals reviewed with patient; copy given to patient.

## 2021-10-26 ENCOUNTER — Other Ambulatory Visit: Payer: Self-pay

## 2021-10-26 DIAGNOSIS — C50919 Malignant neoplasm of unspecified site of unspecified female breast: Secondary | ICD-10-CM

## 2021-10-26 NOTE — Progress Notes (Signed)
Daily Session Note  Patient Details  Name: LEEZA HEINER MRN: [id number] Date of Birth: [birth year] Referring Provider:   April Manson Cancer Associated Rehabilitation & Exercise from 07/25/2021 in [facility] Cardiac and Pulmonary Rehab  Referring Provider Charlaine Dalton MD       Encounter Date: 10/26/2021  Check In:  Session Check In - 10/26/21 1346       Check-In   Supervising physician immediately available to respond to emergencies See telemetry face sheet for immediately available ER MD    Location [facility]-Cardiac & Pulmonary Rehab    Staff Present Coralie Keens, MS, ASCM CEP, Exercise Physiologist;Amanda Oletta Darter, BA, ACSM CEP, Exercise Physiologist;Kelly Rosalia Hammers, MPA, RN    Virtual Visit No    Medication changes reported     No    Fall or balance concerns reported    No    Warm-up and Cool-down Performed on first and last piece of equipment    Resistance Training Performed Yes    VAD Patient? No                Social History   Tobacco Use  Smoking Status Former   Packs/day: 1.00   Years: 3.00   Pack years: 3.00   Types: Cigarettes   Quit date: 09/10/1993   Years since quitting: 28.1  Smokeless Tobacco Never    Goals Met:  Proper associated with RPD/PD & O2 Sat Independence with exercise equipment Exercise tolerated well Personal goals reviewed No report of concerns or symptoms today Strength training completed today  Goals Unmet:  Not Applicable  Comments:  Tyshay graduated today from  rehab with 24 sessions completed.  Details of the patient's exercise prescription and what She needs to do in order to continue the prescription and progress were discussed with patient.  Patient was given a copy of prescription and goals.  Patient verbalized understanding.  Val plans to continue to exercise by exercising outdoors.    Dr. Emily Filbert is Medical Director for [place].  Dr. Ottie Glazier is Medical Director for [facility]  Pulmonary Rehabilitation.

## 2021-10-26 NOTE — Progress Notes (Signed)
CARE Discharge Progress Report  Patient Details  Name: Jamie Wu MRN: [id number] Date of Birth: 10-Apr-1971 Referring Provider:   April [facility] from 07/25/2021 in [facility]  Referring Provider Charlaine Dalton MD        Number of Visits: 24  Reason for Discharge:  Patient reached a stable level of exercise. Patient independent in their exercise. Patient has met program and personal goals.  Smoking History:  Social History   Tobacco Use  Smoking Status Former   Packs/day: 1.00   Years: 3.00   Pack years: 3.00   Types: Cigarettes   Quit date: 09/10/1993   Years since quitting: 28.1  Smokeless Tobacco Never    Diagnosis:  Malignant neoplasm of female breast, unspecified estrogen receptor status, unspecified laterality, unspecified site of breast ([place])  ADL UCSD:   Initial Exercise Prescription:  Initial Exercise Prescription - 07/25/21 1400       Date of Initial Exercise RX and Referring Provider   Date 07/25/21    Referring Provider Charlaine Dalton MD      Treadmill   MPH 2.7    Grade 0    Minutes 15    METs 3.07      Recumbant Bike   Level 1    RPM 60    Minutes 15    METs 4.7      NuStep   Level 1    SPM 80    Minutes 15    METs 4.7      REL-XR   Level 1    Speed 50    Minutes 15    METs 4.7      Prescription Details   Frequency (times per week) 2    Duration Progress to 30 minutes of continuous aerobic without signs/symptoms of physical distress      Intensity   THRR 40-80% of Max Heartrate 114-150    Ratings of Perceived Exertion 11-13    Perceived Dyspnea 0-4      Progression   Progression Continue to progress workloads to maintain intensity without signs/symptoms of physical distress.      Resistance Training   Training Prescription Yes    Weight 3 lb    Reps 10-15             Discharge Exercise Prescription (Final Exercise Prescription  Changes):  Exercise Prescription Changes - 10/24/21 1400       Response to Exercise   Blood Pressure (Admit) 114/70    Blood Pressure (Exercise) 124/76    Blood Pressure (Exit) 102/60    Heart Rate (Admit) 92 bpm    Heart Rate (Exercise) 112 bpm    Heart Rate (Exit) 98 bpm    Oxygen Saturation (Admit) 96 %    Oxygen Saturation (Exercise) 95 %    Oxygen Saturation (Exit) 97 %    Rating of Perceived Exertion (Exercise) 13    Perceived Dyspnea (Exercise) 1    Duration Continue with 30 min of aerobic exercise without signs/symptoms of physical distress.    Intensity THRR unchanged      Progression   Progression Continue to progress workloads to maintain intensity without signs/symptoms of physical distress.    Average METs 3.87      Resistance Training   Training Prescription Yes    Weight 5 lb    Reps 10-15      Interval Training   Interval Training No      Treadmill  MPH 4    Grade 0    Minutes 15    METs 4.06      Recumbant Bike   Level 5    RPM 57    Minutes 15    METs 4.04      Elliptical   Level 1    Speed 3.6    Minutes 15      REL-XR   Level 5    Minutes 15    METs 4.4      Biostep-RELP   Level 2    Minutes 15    METs 3             Functional Capacity:  6 Minute Walk     Row Name 07/25/21 1418 10/17/21 1410       6 Minute Walk   Phase Initial Discharge    Distance 1460 feet 1960 feet    Distance % Change -- 34.2 %    Distance Feet Change -- 500 ft    Walk Time 6 minutes 6 minutes    # of Rest Breaks 0 0    MPH 2.76 3.71    METS 4.74 5.66    RPE 10 13    Perceived Dyspnea  0 0    VO2 Peak 16.6 19.81    Symptoms No No    Resting HR 78 bpm 83 bpm    Resting BP 118/74 116/60    Resting Oxygen Saturation  97 % 99 %    Exercise Oxygen Saturation  during 6 min walk 93 % 96 %    Max Ex. HR 110 bpm 116 bpm    Max Ex. BP 140/76 144/68    2 Minute Post BP 130/76 --             Nutrition & Weight - Outcomes:  Pre Biometrics -  07/25/21 1420       Pre Biometrics   Height 5' 8.75" (1.746 m)    Weight 148 lb 9.6 oz (67.4 kg)    BMI (Calculated) 22.11             Post Biometrics - 10/24/21 1439        Post  Biometrics   Height 5' 8.75" (1.746 m)    Weight 153 lb 11.2 oz (69.7 kg)    BMI (Calculated) 22.87             Goals reviewed with patient; copy given to patient.

## 2021-10-27 ENCOUNTER — Ambulatory Visit
Admission: RE | Admit: 2021-10-27 | Discharge: 2021-10-27 | Disposition: A | Discharge status: Home / Self Care | Payer: 59 | Source: Ambulatory Visit | Attending: Internal Medicine | Admitting: Internal Medicine

## 2021-10-27 ENCOUNTER — Other Ambulatory Visit: Payer: Self-pay

## 2021-10-27 DIAGNOSIS — Z170 Estrogen receptor positive status [ER+]: Secondary | ICD-10-CM | POA: Diagnosis not present

## 2021-10-27 DIAGNOSIS — Z853 Personal history of malignant neoplasm of breast: Secondary | ICD-10-CM | POA: Diagnosis not present

## 2021-10-27 DIAGNOSIS — R590 Localized enlarged lymph nodes: Secondary | ICD-10-CM | POA: Diagnosis not present

## 2021-10-27 DIAGNOSIS — M47812 Spondylosis without myelopathy or radiculopathy, cervical region: Secondary | ICD-10-CM | POA: Diagnosis not present

## 2021-10-27 DIAGNOSIS — K769 Liver disease, unspecified: Secondary | ICD-10-CM | POA: Diagnosis not present

## 2021-10-27 DIAGNOSIS — R221 Localized swelling, mass and lump, neck: Secondary | ICD-10-CM | POA: Diagnosis not present

## 2021-10-27 DIAGNOSIS — K449 Diaphragmatic hernia without obstruction or gangrene: Secondary | ICD-10-CM | POA: Diagnosis not present

## 2021-10-27 DIAGNOSIS — C50812 Malignant neoplasm of overlapping sites of left female breast: Secondary | ICD-10-CM | POA: Insufficient documentation

## 2021-10-27 DIAGNOSIS — J90 Pleural effusion, not elsewhere classified: Secondary | ICD-10-CM | POA: Diagnosis not present

## 2021-10-27 DIAGNOSIS — K5730 Diverticulosis of large intestine without perforation or abscess without bleeding: Secondary | ICD-10-CM | POA: Diagnosis not present

## 2021-10-27 DIAGNOSIS — C7951 Secondary malignant neoplasm of bone: Secondary | ICD-10-CM | POA: Diagnosis not present

## 2021-10-27 DIAGNOSIS — K529 Noninfective gastroenteritis and colitis, unspecified: Secondary | ICD-10-CM | POA: Diagnosis not present

## 2021-10-27 MED ORDER — IOHEXOL 300 MG/ML  SOLN
100.0000 mL | Freq: Once | INTRAMUSCULAR | Status: AC | PRN
  Administered 2021-10-27: 100 mL via INTRAVENOUS

## 2021-11-02 ENCOUNTER — Other Ambulatory Visit: Payer: Self-pay

## 2021-11-02 ENCOUNTER — Inpatient Hospital Stay: Payer: 59 | Attending: Internal Medicine

## 2021-11-02 DIAGNOSIS — Z79811 Long term (current) use of aromatase inhibitors: Secondary | ICD-10-CM | POA: Insufficient documentation

## 2021-11-02 DIAGNOSIS — C7951 Secondary malignant neoplasm of bone: Secondary | ICD-10-CM | POA: Insufficient documentation

## 2021-11-02 DIAGNOSIS — Z87891 Personal history of nicotine dependence: Secondary | ICD-10-CM | POA: Insufficient documentation

## 2021-11-02 DIAGNOSIS — C782 Secondary malignant neoplasm of pleura: Secondary | ICD-10-CM | POA: Insufficient documentation

## 2021-11-02 DIAGNOSIS — Z923 Personal history of irradiation: Secondary | ICD-10-CM | POA: Insufficient documentation

## 2021-11-02 DIAGNOSIS — Z170 Estrogen receptor positive status [ER+]: Secondary | ICD-10-CM

## 2021-11-02 DIAGNOSIS — C50812 Malignant neoplasm of overlapping sites of left female breast: Secondary | ICD-10-CM | POA: Insufficient documentation

## 2021-11-02 DIAGNOSIS — C50912 Malignant neoplasm of unspecified site of left female breast: Secondary | ICD-10-CM

## 2021-11-02 DIAGNOSIS — Z8616 Personal history of COVID-19: Secondary | ICD-10-CM | POA: Insufficient documentation

## 2021-11-02 NOTE — Progress Notes (Signed)
Nutrition Assessment   Reason for Assessment:   Referred from [place], [place] with CARE program   ASSESSMENT:  51 year old female with recurrent metastatic breast cancer.  Past medical history of left lumpectomy 2008 and radiation.  Patient currently on kisqali and letrozole.    Met with patient in clinic.  Patient reports good appetite.  Completed the CARE program on 2/21 and so glad that she was able to participate in this program.  Reports that she is exercising 3 days per week (stationary bike or walking) and yoga stretching the other 2 days.  Reports that she feels better after exercising.  Wanted to learn nutrition wise how to improve bone health, immunity and any diet strategies to help with hot flashes.     Medications: reviewed   Labs: reviewed Vit D 1/30 35   Anthropometrics:   Height: 68 inches Weight: 153 lb 11.2 oz 08/15/21 149 lb  10/20 148 lb 14.4 oz BMI: 22   NUTRITION DIAGNOSIS: Food and nutrition related knowledge deficit related to cancer as evidenced by questions and request to see RD   INTERVENTION:  Reviewed current guidelines for nutrition for breast cancer survivors from [employer] for [employer]. Handout provided Reviewed foods high in calcium and vit D.  Handouts provided. Message sent to MD regarding patient currently off calcium and vit D supplements. Reviewed strategies to help with hot flashes.   Contact information provided.     MONITORING, EVALUATION, GOAL: weight trends, intake   Next Visit: no follow-up  Patient to contact RD as needed  Jaskiran Pata B. Zenia Resides, [place], [place] Registered Dietitian [phone] (mobile)

## 2021-11-06 ENCOUNTER — Inpatient Hospital Stay (HOSPITAL_BASED_OUTPATIENT_CLINIC_OR_DEPARTMENT_OTHER): Payer: 59 | Admitting: Internal Medicine

## 2021-11-06 ENCOUNTER — Other Ambulatory Visit: Payer: Self-pay

## 2021-11-06 ENCOUNTER — Inpatient Hospital Stay: Payer: 59

## 2021-11-06 ENCOUNTER — Encounter: Payer: Self-pay | Admitting: Internal Medicine

## 2021-11-06 DIAGNOSIS — Z170 Estrogen receptor positive status [ER+]: Secondary | ICD-10-CM

## 2021-11-06 DIAGNOSIS — Z79811 Long term (current) use of aromatase inhibitors: Secondary | ICD-10-CM | POA: Diagnosis not present

## 2021-11-06 DIAGNOSIS — Z8616 Personal history of COVID-19: Secondary | ICD-10-CM | POA: Diagnosis not present

## 2021-11-06 DIAGNOSIS — C782 Secondary malignant neoplasm of pleura: Secondary | ICD-10-CM | POA: Diagnosis not present

## 2021-11-06 DIAGNOSIS — C50912 Malignant neoplasm of unspecified site of left female breast: Secondary | ICD-10-CM

## 2021-11-06 DIAGNOSIS — C7951 Secondary malignant neoplasm of bone: Secondary | ICD-10-CM | POA: Diagnosis not present

## 2021-11-06 DIAGNOSIS — Z87891 Personal history of nicotine dependence: Secondary | ICD-10-CM | POA: Diagnosis not present

## 2021-11-06 DIAGNOSIS — C50812 Malignant neoplasm of overlapping sites of left female breast: Secondary | ICD-10-CM

## 2021-11-06 DIAGNOSIS — Z923 Personal history of irradiation: Secondary | ICD-10-CM | POA: Diagnosis not present

## 2021-11-06 LAB — CBC WITH DIFFERENTIAL/PLATELET
Abs Immature Granulocytes: 0.01 10*3/uL (ref 0.00–0.07)
Basophils Absolute: 0.1 10*3/uL (ref 0.0–0.1)
Basophils Relative: 3 %
Eosinophils Absolute: 0.1 10*3/uL (ref 0.0–0.5)
Eosinophils Relative: 2 %
HCT: 38.5 % (ref 36.0–46.0)
Hemoglobin: 13.4 g/dL (ref 12.0–15.0)
Immature Granulocytes: 0 %
Lymphocytes Relative: 27 %
Lymphs Abs: 0.9 10*3/uL (ref 0.7–4.0)
MCH: 33.4 pg (ref 26.0–34.0)
MCHC: 34.8 g/dL (ref 30.0–36.0)
MCV: 96 fL (ref 80.0–100.0)
Monocytes Absolute: 0.5 10*3/uL (ref 0.1–1.0)
Monocytes Relative: 15 %
Neutro Abs: 1.7 10*3/uL (ref 1.7–7.7)
Neutrophils Relative %: 53 %
Platelets: 168 10*3/uL (ref 150–400)
RBC: 4.01 MIL/uL (ref 3.87–5.11)
RDW: 12.9 % (ref 11.5–15.5)
WBC: 3.2 10*3/uL — ABNORMAL LOW (ref 4.0–10.5)
nRBC: 0 % (ref 0.0–0.2)

## 2021-11-06 LAB — COMPREHENSIVE METABOLIC PANEL
ALT: 78 U/L — ABNORMAL HIGH (ref 0–44)
AST: 61 U/L — ABNORMAL HIGH (ref 15–41)
Albumin: 4.1 g/dL (ref 3.5–5.0)
Alkaline Phosphatase: 91 U/L (ref 38–126)
Anion gap: 5 (ref 5–15)
BUN: 14 mg/dL (ref 6–20)
CO2: 29 mmol/L (ref 22–32)
Calcium: 9.4 mg/dL (ref 8.9–10.3)
Chloride: 102 mmol/L (ref 98–111)
Creatinine, Ser: 0.83 mg/dL (ref 0.44–1.00)
GFR, Estimated: >60 mL/min (ref >60)
Glucose, Bld: 63 mg/dL — ABNORMAL LOW (ref 70–99)
Potassium: 3.8 mmol/L (ref 3.5–5.1)
Sodium: 136 mmol/L (ref 135–145)
Total Bilirubin: 0.4 mg/dL (ref 0.3–1.2)
Total Protein: 7.5 g/dL (ref 6.5–8.1)

## 2021-11-06 LAB — VITAMIN D 25 HYDROXY (VIT D DEFICIENCY, FRACTURES): Vit D, 25-Hydroxy: 24.63 ng/mL — ABNORMAL LOW (ref 30–100)

## 2021-11-06 NOTE — Patient Instructions (Signed)
Please give pt information on zometa.

## 2021-11-06 NOTE — Progress Notes (Signed)
[place] OFFICE PROGRESS NOTE  Patient Care Team: Rusty Aus, [name], [name], [name], [name], MD (Inactive) as Consulting Physician (Family Medicine) Lloyd Huger, MD as Consulting Physician (Hematology and Oncology)   Cancer Staging  Carcinoma of overlapping sites of left breast in female, estrogen receptor positive [facility]) Staging form: Breast, AJCC 8th Edition - Clinical stage from 05/11/2021: Stage IV (rcTX, [name], pM1, [name], [name], [name], MD on 05/11/2021 Stage prefix: Recurrence    Oncology History Overview Note  # 2008- Left breast cancer [s/p Lumpec]; [name]; Tamoxifen x4 years   # DEC 2021/Jan 2022- cough/COVID [out pt]   #  Left breast cancer ([name]/PR+) dx in 2008; [name], [name], tamoxifen x 5 years, 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     % of Cells Staining Intensity Score Interpretation  ER IHC 73 2+ POSITIVE  PR IHC 4 3+ LOW POSITIVE  HER2/neu IHC 28 2+ EQUIVOCAL    HER2/NEU FISH RESULTS WILL BE ISSUED LATER IN THIS REPORT.  Electronically signed by FElspeth Cho MD on 05/04/2021 at  2:47 PM  Addendum    INTERPRETATION    HER2/Neu Gene Copy Number Centromere 17 ([id number]) Gene Copy Number HER2/Neu to CEP17 Ratio Interpretation  HER2/Neu FISH  2.4 1.6 1.65 NOT AMPLIFIED     CT- SEP 2022-Numerous new hepatic lesions compatible with metastatic disease, [name], [name], [name], [name], not associated with overt nodularity.   Improvement with respect to ground-glass and septal thickening in the RIGHT chest. Correlate with any ongoing respiratory symptoms.   Scattered sclerotic lesions with similar appearance seen throughout the spine and in the pelvis and LEFT proximal femur --------------------------  # Left pleurex catheter- OUT NOV 2022 [Dr.Wahidi; [facility]; pul]   Carcinoma of overlapping sites of left breast in [place], [place]  05/11/2021 Initial Diagnosis   [place]arcin[place] of left breast in [place], [place] positive [place]   05/11/2021 Cancer Staging   Staging form: Breast, AJCC 8th Edition - Clinical stage from 05/11/2021: Stage IV (rcTX, [name], pM1, [name], [name], HER2-) - Signed by FLloyd Huger MD on 05/11/2021 Stage prefix: Recurrence      INTERVAL HISTORY: Ambulating independently.  Accompanied by her husband  Jamie KOENEN89y.o.  female pleasant patient above history of + metastatic recurrent ER/PR positive breast cancer is on letrozole plus ribociclib is here for follow-up/review results of the CT scan.  Patient continues to have intermittent left chest wall  pain.  No aggravating or relieving factors.  Also complains of intermittent discomfort in the mid back.  NSAIDs only as needed.   Denies any worsening shortness of breath or cough.   No dyspnea.  Patient notes to have improvement of energy levels.   Joint pains improved.  No abdominal pain or diarrhea.  Review of Systems  Constitutional:  Positive for malaise/fatigue and weight loss. Negative for chills, [name], sputum production and wheezing.   Cardiovascular:  Negative for chest pain, palpitations, orthopnea and leg swelling.  Gastrointestinal:  Negative for abdominal pain, blood in stool, constipation, diarrhea, heartburn, melena, nausea and vomiting.  Genitourinary:  Negative for dysuria, frequency and urgency.  Musculoskeletal:  Positive for joint pain. Negative for back pain.  Skin: Negative.  Negative for itching and rash.  Neurological:  Negative for dizziness, tingling, focal weakness, weakness and headaches.  Endo/Heme/Allergies:  Does not bruise/bleed easily.  Psychiatric/Behavioral:  Negative for depression. The patient is not nervous/anxious and does not have insomnia.      PAST MEDICAL HISTORY :  Past Medical History:  Diagnosis Date   Breast cancer ([place]) 2008   left breast   Personal history of malignant neoplasm of breast 2008   L LUMPECTOMY   Personal history of radiation therapy 2008   LEFT lumpectomy 2008    PAST SURGICAL HISTORY :   Past Surgical History:  Procedure Laterality Date   BREAST BIOPSY Right June 25, 2012   MRI abnormality showing fibrocystic changes with microcalcifications.   BREAST EXCISIONAL BIOPSY Left 2008   breast ca   BREAST LUMPECTOMY Left 2008   CHOLECYSTECTOMY  1998   COLONOSCOPY WITH PROPOFOL N/A 07/29/2020   Procedure: COLONOSCOPY WITH PROPOFOL;  [name], MD;  Location:  [facility] ENDOSCOPY;  Service: Endoscopy;  [name];    FAMILY HISTORY :   Family History  Problem Relation Age of Onset   Colon cancer Paternal Uncle        lat3 33's    Breast cancer Neg Hx     SOCIAL HISTORY:   Social History   Tobacco Use   Smoking status: Former    Packs/day: 1.00    Years: 3.00    Pack years: 3.00    Types: Cigarettes    Quit date: 09/10/1993    Years since quitting: 28.1   Smokeless tobacco: Never  Vaping Use   Vaping Use: Never used  Substance Use Topics   Alcohol use: Yes    Alcohol/week: 0.0 standard drinks    Comment: occas-wine   Drug use: No    ALLERGIES:  is allergic to contrast media [iodinated contrast media] and sulfa antibiotics.  MEDICATIONS:  Current Outpatient Medications  Medication Sig Dispense Refill   letrozole (FEMARA) 2.5 MG tablet Take 1 tablet (2.5 mg total) by mouth daily. 90 tablet 1   Magnesium 250 MG TABS Take 250 mg by mouth at bedtime.     Melatonin 5 MG CAPS Take 5 mg by mouth at bedtime.     Multiple Vitamin (MULTIVITAMIN) tablet Take 1 tablet by mouth daily.     ribociclib succ (KISQALI, 600 MG DOSE,)  200 MG Therapy Pack Take 3 tablets (600 mg total) by mouth daily. Take for 21 days on, 7 days off, repeat every 28 days. 63 tablet 2   predniSONE (DELTASONE) 50 MG tablet Take 1 pill 50 mg orally 13 hours; 1 pill 7 hours and 1 hours prior to scan. (Patient not taking: Reported on 11/06/2021) 3 tablet 4   No current facility-administered medications for this visit.    PHYSICAL EXAMINATION: ECOG PERFORMANCE STATUS: 1 - Symptomatic but completely ambulatory  BP (!) 142/90 (BP Location: Right Arm, Patient Position: Sitting, Cuff Size: Normal)    Pulse 84    Temp 98.6 F (37 C) (Tympanic)    Ht 5' 8.75" (1.746 m)    Wt 153 lb (69.4 kg)    SpO2 99%    BMI 22.76 kg/m   Filed Weights   11/06/21 1004  Weight: 153 lb (69.4 kg)    Physical Exam Vitals and nursing note reviewed.  HENT:     Head: Normocephalic and  atraumatic.     Mouth/Throat:     Pharynx: Oropharynx is clear.  Eyes:     Extraocular Movements: Extraocular movements intact.     Pupils: Pupils are equal, round, and reactive to light.  Cardiovascular:     Rate and Rhythm: Normal rate and regular rhythm.  Pulmonary:     Comments: Decreased breath sounds bilaterally.  Abdominal:     Palpations: Abdomen is soft.  Musculoskeletal:        General: Normal range of motion.     Cervical back: Normal range of motion.  Skin:    General: Skin is warm.  Neurological:     General: No focal deficit present.     Mental Status: She is alert and oriented to person, place, and time.  Psychiatric:        Behavior: Behavior normal.        Judgment: Judgment normal.       LABORATORY DATA:  I have reviewed the data as listed    Component Value Date/Time   NA 136 11/06/2021 0953   K 3.8 11/06/2021 0953   CL 102 11/06/2021 0953   CO2 29 11/06/2021 0953   GLUCOSE 63 (L) 11/06/2021 0953   BUN 14 11/06/2021 0953   CREATININE 0.83 11/06/2021 0953   CALCIUM 9.4 11/06/2021 0953   PROT 7.5 11/06/2021 0953   ALBUMIN 4.1 11/06/2021 0953   AST 61 (H) 11/06/2021 0953   ALT 78 (H) 11/06/2021 0953   ALKPHOS 91 11/06/2021 0953   BILITOT 0.4 11/06/2021 0953   GFRNONAA >60 11/06/2021 0953   GFRAA >60 05/02/2017 0936    No results found for: SPEP, UPEP  Lab Results  Component Value Date   WBC 3.2 (L) 11/06/2021   NEUTROABS 1.7 11/06/2021   HGB 13.4 11/06/2021   HCT 38.5 11/06/2021   MCV 96.0 11/06/2021   PLT 168 11/06/2021      Chemistry      Component Value Date/Time   NA 136 11/06/2021 0953   K 3.8 11/06/2021 0953   CL 102 11/06/2021 0953   CO2 29 11/06/2021 0953   BUN 14 11/06/2021 0953   CREATININE 0.83 11/06/2021 0953      Component Value Date/Time   CALCIUM 9.4 11/06/2021 0953   ALKPHOS 91 11/06/2021 0953   AST 61 (H) 11/06/2021 0953   ALT 78 (H) 11/06/2021 0953   BILITOT 0.4 11/06/2021 0953       RADIOGRAPHIC  STUDIES: I have personally reviewed  the radiological images as listed and agreed with the findings in the report. No results found.   ASSESSMENT & PLAN:  Carcinoma of overlapping sites of left breast in female, estrogen receptor positive ([place]) #Recurrent stage IV ER positive HER2 negative breast cancer-currently on Femara plus on ribociclib - [3 weeks on 1 week off] .  FEB 20th, 2023-continued partial response with therapy in the improvement of the liver metastases; no lesions new lesions noted; Decreasing moderate right pleural effusion. Small left pleural effusion has slightly increased in size following removal of unneled left-sided pleural drainage catheter.  Widespread metastatic disease to the bones appears unchanged. No new extra skeletal metastatic disease otherwise noted.  # Proceed with cycle #7of Ribociclib + Femara-tolerating well. Labs today reviewed;  acceptable for treatment today [ANC-1.2].    # Left sided neck LN-imaging.  None currently.  Check CT scan- neck-FEB 2023- NEG any abnormal lymph nodes question salivary gland.  Asymptomatic.  # Hand joint pains-likely secondary to Femara. -Improved--on calcium plus vitamin D- monitor for now.  # Right > ;-left-sided Pleurx cath thoracoscopy [followed by Dr.Wahidi in [place].  Left chest wall pain-?  Scar tissue/Pleurx catheter rather than true malignancy- S/p explantation- in NOV 2022.  Stable.  # Sclerotic bone lesions-metastatic disease; STABLE bone lesion; zometa/ bisphosphonate therapy. Discussed the role of Zometa to decrease skeletal related events [pain; fractures; need for radiation; hypercalcemia].  Patient has previously declined Zometa.  However this time she is open; will give her bisphosphonate/Zometa patient information.  Will discuss at next visit.  Discussed that we will hold off radiation until patient is symptomatic with pain.  # LFTs- G-1- monitor for now.  Likely secondary to ribociclib.  #Incidental findings on  Imaging CT scan February, 2023:? inflammatory changes adjacent to the proximal sigmoid colon which could be indicative of early or mild acute diverticulitis-clinically asymptomatic. Aortic atherosclerosis. I reviewed/discussed/counseled the patient.   dye prep- # DISPOSITION: # follow up in 4 weeks-./Monday MD; labs- cbc/cmp; ca 27-29; CEA; Ca15-3;--Dr.B  # I reviewed the blood work- with the patient in detail; also reviewed the imaging independently [as summarized above]; and with the patient in detail.        Orders Placed This Encounter  Procedures   CBC with Differential/Platelet    Standing Status:   Future    Standing Expiration Date:   11/06/2022   Comprehensive metabolic panel    Standing Status:   Future    Standing Expiration Date:   11/06/2022   Cancer antigen 27.29    Standing Status:   Future    Standing Expiration Date:   11/06/2022   CEA    Standing Status:   Future    Standing Expiration Date:   11/06/2022   Cancer antigen 15-3    Standing Status:   Future    Standing Expiration Date:   11/06/2022    All questions were answered. The patient knows to call the clinic with any problems, questions or concerns.      Cammie Sickle, MD 11/06/2021 10:56 AM

## 2021-11-06 NOTE — Assessment & Plan Note (Addendum)
#  Recurrent stage IV ER positive HER2 negative breast cancer-currently on Femara plus on ribociclib - [3 weeks on 1 week off] .  FEB 20th, 2023-continued partial response with therapy in the improvement of the liver metastases; no lesions new lesions noted; Decreasing moderate right pleural effusion. Small left pleural effusion has slightly increased in size following removal of unneled left-sided pleural drainage catheter.  Widespread metastatic disease to the bones appears unchanged. No new extra skeletal metastatic disease otherwise noted.  # Proceed with cycle #7of Ribociclib + Femara-tolerating well. Labs today reviewed;  acceptable for treatment today [ANC-1.2].    # Left sided neck LN-imaging.  None currently.  Check CT scan- neck-FEB 2023- NEG any abnormal lymph nodes question salivary gland.  Asymptomatic.  # Hand joint pains-likely secondary to Femara. -Improved--on calcium plus vitamin D- monitor for now.  # Right > ;-left-sided Pleurx cath thoracoscopy [followed by Dr.Wahidi in [place].  Left chest wall pain-?  Scar tissue/Pleurx catheter rather than true malignancy- S/p explantation- in NOV 2022.  Stable.  # Sclerotic bone lesions-metastatic disease; STABLE bone lesion; zometa/ bisphosphonate therapy. Discussed the role of Zometa to decrease skeletal related events [pain; fractures; need for radiation; hypercalcemia].  Patient has previously declined Zometa.  However this time she is open; will give her bisphosphonate/Zometa patient information.  Will discuss at next visit.  Discussed that we will hold off radiation until patient is symptomatic with pain.  # LFTs- G-1- monitor for now.  Likely secondary to ribociclib.  #Incidental findings on Imaging CT scan February, 2023:? inflammatory changes adjacent to the proximal sigmoid colon which could be indicative of early or mild acute diverticulitis-clinically asymptomatic. Aortic atherosclerosis. I reviewed/discussed/counseled the patient.    dye prep- # DISPOSITION: # follow up in 4 weeks-./Monday MD; labs- cbc/cmp; ca 27-29; CEA; Ca15-3;--Dr.B  # I reviewed the blood work- with the patient in detail; also reviewed the imaging independently [as summarized above]; and with the patient in detail.

## 2021-11-07 LAB — CANCER ANTIGEN 15-3: CA 15-3: 104 U/mL — ABNORMAL HIGH (ref 0.0–25.0)

## 2021-11-07 LAB — CANCER ANTIGEN 27.29: CA 27.29: 120.1 U/mL — ABNORMAL HIGH (ref 0.0–38.6)

## 2021-11-07 LAB — CEA: CEA: 10.3 ng/mL — ABNORMAL HIGH (ref 0.0–4.7)

## 2021-11-08 ENCOUNTER — Other Ambulatory Visit (HOSPITAL_COMMUNITY): Payer: Self-pay

## 2021-11-10 DIAGNOSIS — Z853 Personal history of malignant neoplasm of breast: Secondary | ICD-10-CM | POA: Diagnosis not present

## 2021-11-10 DIAGNOSIS — K769 Liver disease, unspecified: Secondary | ICD-10-CM | POA: Diagnosis not present

## 2021-11-10 DIAGNOSIS — Z882 Allergy status to sulfonamides status: Secondary | ICD-10-CM | POA: Diagnosis not present

## 2021-11-10 DIAGNOSIS — C7801 Secondary malignant neoplasm of right lung: Secondary | ICD-10-CM | POA: Diagnosis not present

## 2021-11-10 DIAGNOSIS — C7802 Secondary malignant neoplasm of left lung: Secondary | ICD-10-CM | POA: Diagnosis not present

## 2021-11-10 DIAGNOSIS — M899 Disorder of bone, unspecified: Secondary | ICD-10-CM | POA: Diagnosis not present

## 2021-11-10 DIAGNOSIS — C7951 Secondary malignant neoplasm of bone: Secondary | ICD-10-CM | POA: Diagnosis not present

## 2021-11-10 DIAGNOSIS — J910 Malignant pleural effusion: Secondary | ICD-10-CM | POA: Diagnosis not present

## 2021-11-10 DIAGNOSIS — Z9049 Acquired absence of other specified parts of digestive tract: Secondary | ICD-10-CM | POA: Diagnosis not present

## 2021-11-10 DIAGNOSIS — Z800 Family history of malignant neoplasm of digestive organs: Secondary | ICD-10-CM | POA: Diagnosis not present

## 2021-11-10 DIAGNOSIS — Z87891 Personal history of nicotine dependence: Secondary | ICD-10-CM | POA: Diagnosis not present

## 2021-11-10 DIAGNOSIS — Z91041 Radiographic dye allergy status: Secondary | ICD-10-CM | POA: Diagnosis not present

## 2021-11-13 ENCOUNTER — Other Ambulatory Visit: Payer: Self-pay | Admitting: General Surgery

## 2021-11-13 DIAGNOSIS — Z853 Personal history of malignant neoplasm of breast: Secondary | ICD-10-CM

## 2021-11-21 DIAGNOSIS — Z170 Estrogen receptor positive status [ER+]: Secondary | ICD-10-CM | POA: Diagnosis not present

## 2021-11-21 DIAGNOSIS — C782 Secondary malignant neoplasm of pleura: Secondary | ICD-10-CM | POA: Diagnosis not present

## 2021-11-21 DIAGNOSIS — C50919 Malignant neoplasm of unspecified site of unspecified female breast: Secondary | ICD-10-CM | POA: Diagnosis not present

## 2021-11-23 DIAGNOSIS — C50912 Malignant neoplasm of unspecified site of left female breast: Secondary | ICD-10-CM | POA: Diagnosis not present

## 2021-11-23 DIAGNOSIS — Z87891 Personal history of nicotine dependence: Secondary | ICD-10-CM | POA: Diagnosis not present

## 2021-11-23 DIAGNOSIS — I700 Atherosclerosis of aorta: Secondary | ICD-10-CM | POA: Diagnosis not present

## 2021-11-23 DIAGNOSIS — Z800 Family history of malignant neoplasm of digestive organs: Secondary | ICD-10-CM | POA: Diagnosis not present

## 2021-11-23 DIAGNOSIS — Z9049 Acquired absence of other specified parts of digestive tract: Secondary | ICD-10-CM | POA: Diagnosis not present

## 2021-11-23 DIAGNOSIS — M898X9 Other specified disorders of bone, unspecified site: Secondary | ICD-10-CM | POA: Diagnosis not present

## 2021-11-23 DIAGNOSIS — C782 Secondary malignant neoplasm of pleura: Secondary | ICD-10-CM | POA: Diagnosis not present

## 2021-11-23 DIAGNOSIS — C7951 Secondary malignant neoplasm of bone: Secondary | ICD-10-CM | POA: Diagnosis not present

## 2021-11-23 DIAGNOSIS — Z79811 Long term (current) use of aromatase inhibitors: Secondary | ICD-10-CM | POA: Diagnosis not present

## 2021-11-23 DIAGNOSIS — Z91041 Radiographic dye allergy status: Secondary | ICD-10-CM | POA: Diagnosis not present

## 2021-11-23 DIAGNOSIS — J910 Malignant pleural effusion: Secondary | ICD-10-CM | POA: Diagnosis not present

## 2021-11-23 DIAGNOSIS — Z853 Personal history of malignant neoplasm of breast: Secondary | ICD-10-CM | POA: Diagnosis not present

## 2021-11-24 DIAGNOSIS — C787 Secondary malignant neoplasm of liver and intrahepatic bile duct: Secondary | ICD-10-CM | POA: Diagnosis not present

## 2021-11-24 DIAGNOSIS — C50912 Malignant neoplasm of unspecified site of left female breast: Secondary | ICD-10-CM | POA: Diagnosis not present

## 2021-11-24 DIAGNOSIS — J910 Malignant pleural effusion: Secondary | ICD-10-CM | POA: Diagnosis not present

## 2021-11-24 DIAGNOSIS — C7951 Secondary malignant neoplasm of bone: Secondary | ICD-10-CM | POA: Diagnosis not present

## 2021-12-04 ENCOUNTER — Ambulatory Visit: Payer: 59 | Admitting: Internal Medicine

## 2021-12-04 ENCOUNTER — Other Ambulatory Visit: Payer: 59

## 2021-12-05 ENCOUNTER — Other Ambulatory Visit: Payer: 59

## 2021-12-06 ENCOUNTER — Other Ambulatory Visit: Payer: Self-pay | Admitting: Internal Medicine

## 2021-12-06 DIAGNOSIS — C50919 Malignant neoplasm of unspecified site of unspecified female breast: Secondary | ICD-10-CM

## 2021-12-06 NOTE — Telephone Encounter (Signed)
CBC with Differential/Platelet ?Order: [id number] ?Status: Final result    ?Visible to patient: Yes (seen)    ?Next appt: 12/28/2021 at 09:45 AM in Oncology ([facility])    ?Dx: Carcinoma of overlapping sites of lef...    ?0 Result Notes ?          ?Component Ref Range & Units 1 mo ago ?(11/06/21) 1 mo ago ?(10/09/21) 2 mo ago ?(09/12/21) 3 mo ago ?(08/15/21) 4 mo ago ?(07/17/21) 5 mo ago ?(06/29/21) 5 mo ago ?(06/22/21)  ?WBC 4.0 - 10.5 K/uL 3.2 Low   2.0 Low   2.6 Low   3.0 Low   2.6 Low   3.2 Low   2.5 Low    ?RBC 3.87 - 5.11 MIL/uL 4.01  4.18  3.72 Low   3.77 Low   3.98  4.01  4.17   ?Hemoglobin 12.0 - 15.0 g/dL 13.4  14.2  12.7  12.6  12.8  12.3  12.6   ?HCT 36.0 - 46.0 % 38.5  39.8  35.4 Low   34.7 Low   35.3 Low   35.1 Low   35.6 Low    ?MCV 80.0 - 100.0 fL 96.0  95.2  95.2  92.0  88.7  87.5  85.4   ?MCH 26.0 - 34.0 pg 33.4  34.0  34.1 High   33.4  32.2  30.7  30.2   ?MCHC 30.0 - 36.0 g/dL 34.8  35.7  35.9  36.3 High   36.3 High   35.0  35.4   ?RDW 11.5 - 15.5 % 12.9  13.2  14.1  15.8 High   18.1 High   15.9 High   14.5   ?Platelets 150 - 400 K/uL 168  184  237  241  191  366  214   ?nRBC 0.0 - 0.2 % 0.0  0.0  0.0  0.0  0.0  0.0  0.0   ?Neutrophils Relative % % 53  46  48  51  48  79  50   ?Neutro Abs 1.7 - 7.7 K/uL 1.7  1.0 Low   1.2 Low   1.6 Low   1.2 Low   2.5  1.2 Low    ?Lymphocytes Relative % '27  31  26  24  28  13  28   '$ ?Lymphs Abs 0.7 - 4.0 K/uL 0.9  0.6 Low   0.7  0.7  0.7  0.4 Low   0.7   ?Monocytes Relative % '15  14  20  18  18  5  18   '$ ?Monocytes Absolute 0.1 - 1.0 K/uL 0.5  0.3  0.5  0.5  0.5  0.2  0.5   ?Eosinophils Relative % '2  4  3  3  2  1  1   '$ ?Eosinophils Absolute 0.0 - 0.5 K/uL 0.1  0.1  0.1  0.1  0.1  0.0  0.0   ?Basophils Relative % '3  4  3  3  4  2  3   '$ ?Basophils Absolute 0.0 - 0.1 K/uL 0.1  0.1  0.1  0.1  0.1  0.1  0.1   ?Immature Granulocytes % 0  1  0  1  0  0  0   ?Abs Immature Granulocytes 0.00 - 0.07 K/uL 0.01  0.01 CM  0.01 CM  0.02 CM  0.01 CM  0.01 CM  0.00 CM   ?Comment:  Performed at [facility], [address], [place], [place] [address]  ?  WBC Morphology       DIFF. CONFIRMED BY SMEAR    ?RBC Morphology       MORPHOLOGY UNREMARKABLE    ?Smear Review       Normal platelet morphology CM    ?Resulting Agency  [facility]  ?  ? ?  ?  ?Specimen Collected: 11/06/21 09:53 Last Resulted: 11/06/21 10:06  ?  ?  Lab Flowsheet   ? Order Details   ? View Encounter   ? Lab and Collection Details   ? Routing   ? Result History    ?View Encounter Conversation    ?  ?CM=Additional comments    ?  ?Result Care Coordination ? ? ?Patient Communication ? ? Add Comments   Seen Back to Top  ?  ?  ? ?Other Results from 11/06/2021 ? ? Contains abnormal data Vitamin D 25 hydroxy ?Order: [id number] ?Status: Final result    ?Visible to patient: Yes (seen)    ?Next appt: 12/28/2021 at 09:45 AM in Oncology ([facility])    ?Dx: Carcinoma of left breast in female, e...    ?0 Result Notes ?       ?Component Ref Range & Units 1 mo ago ?(11/06/21) 1 mo ago ?(10/09/21) 5 mo ago ?(06/22/21) 6 mo ago ?(06/08/21)  ?Vit D, 25-Hydroxy 30 - 100 ng/mL 24.63 Low   35.22 CM  52.94 CM  48.31 CM   ?Comment: (NOTE)  ?Vitamin D deficiency has been defined by the Institute of Medicine  ?and an Endocrine Society practice guideline as a level of serum 25-OH  ?vitamin D less than 20 ng/mL (1,2). The Endocrine Society went on to  ?further define vitamin D insufficiency as a level between 21 and 29  ?ng/mL (2).  ? ?1. IOM [employer] of Medicine). 2010. Dietary reference intakes for  ?calcium and D. [place]: The [employer].  ?2. Holick MF, Binkley [place], Bischoff-Ferrari HA, et al. Evaluation,  ?treatment, and prevention of vitamin D deficiency: an Endocrine  ?Society clinical practice guideline, JCEM. 2011 Jul; 96(7): 1911-30.  ? ?Performed at [facility], [place] [address] [place], [place]  ?[address]   ?Resulting Agency  [facility] [place]  [facility]  ?  ? ?  ?  ?Specimen Collected: 11/06/21 09:53 Last Resulted: 11/06/21 15:11  ?  ?  Lab Flowsheet   ? Order Details   ? View Encounter   ? Lab and Collection Details   ? Routing   ? Result History    ?View Encounter Conversation    ?  ?CM=Additional comments    ?  ?Result Care Coordination ? ? ?Patient Communication ? ? Add Comments   Seen Back to Top  ?  ?  ? ?  ? Contains abnormal data Cancer antigen 15-3 ?Order: [id number] ?Status: Final result    ?Visible to patient: Yes (seen)    ?Next appt: 12/28/2021 at 09:45 AM in Oncology ([facility])    ?Dx: Carcinoma of overlapping sites of lef...    ?0 Result Notes ?        ?Component Ref Range & Units 1 mo ago ?(11/06/21) 1 mo ago ?(10/09/21) 2 mo ago ?(09/12/21) 3 mo ago ?(08/15/21) 4 mo ago ?(07/17/21)  ?CA 15-3 0.0 - 25.0 U/mL 104.0 High   136.0 High  CM  137.0 High  CM  129.0 High  CM  215.0 High  CM   ?Comment: (NOTE)  ?Roche Diagnostics Electrochemiluminescence Immunoassay [facility])  ?Values obtained with different assay methods or kits cannot be  ?used interchangeably.  Results cannot be interpreted as absolute  ?evidence of the presence or absence of malignant disease.  ?Performed At: [facility]  ?[address] [place], [place] [id number]  ?Rush Farmer MD [id number]   ?Resulting Agency  [facility] [place] [facility]  ?  ? ?  ?  ?Specimen Collected: 11/06/21 09:53 Last Resulted: 11/07/21 06:37  ?  ?  Lab Flowsheet   ? Order Details   ? View Encounter   ? Lab and Collection Details   ? Routing   ? Result History    ?View Encounter Conversation    ?  ?CM=Additional comments    ?  ?Result Care Coordination ? ? ?Patient Communication ? ? Add Comments   Seen Back to Top  ?  ?  ? ?  ? Contains abnormal data CEA ?Order: [id number] ?Status: Final result    ?Visible to patient: Yes (seen)    ?Next appt: 12/28/2021 at 09:45 AM in Oncology ([facility])    ?Dx: Carcinoma of overlapping sites of lef...     ?0 Result Notes ?        ?Component Ref Range & Units 1 mo ago ?(11/06/21) 1 mo ago ?(10/09/21) 2 mo ago ?(09/12/21) 3 mo ago ?(08/15/21) 4 mo ago ?(07/17/21)  ?CEA 0.0 - 4.7 ng/mL 10.3 High   12.8 High  CM  13.5 High  CM  13.7 High  CM  23.3 High  CM   ?Comment: (NOTE)  ?                            Nonsmokers          <3.9  ?                            Smokers             <5.6  ?Roche Diagnostics Electrochemiluminescence Immunoassay  ?(ECLIA)  ?Values obtained with different assay methods or kits  ?cannot be used interchangeably.  Results cannot be  ?interpreted as absolute evidence of the presence or  ?absence of malignant disease.  ?Performed At: [facility]  ?[address] [place], [place] [id number]  ?Rush Farmer MD [id number]   ?Resulting Agency  [facility] [place] [facility] [place]  ?  ? ?  ?  ?Specimen Collected: 11/06/21 09:53 Last Resulted: 11/07/21 06:37  ?  ?  Lab Flowsheet   ? Order Details   ? View Encounter   ? Lab and Collection Details   ? Routing   ? Result History    ?View Encounter Conversation    ?  ?CM=Additional comments    ?  ?Result Care Coordination ? ? ?Patient Communication ? ? Add Comments   Seen Back to Top  ?  ?  ? ?  ? Contains abnormal data Cancer antigen 27.29 ?Order: [id number] ?Status: Final result    ?Visible to patient: Yes (seen)    ?Next appt: 12/28/2021 at 09:45 AM in Oncology ([facility])    ?Dx: Carcinoma of overlapping sites of lef...    ?0 Result Notes ?          ?  Component Ref Range & Units 1 mo ago ?(11/06/21) 1 mo ago ?(10/09/21) 2 mo ago ?(09/12/21) 3 mo ago ?(08/15/21) 4 mo ago ?(07/17/21) 5 mo ago ?(06/22/21) 6 mo ago ?(06/08/21)  ?CA 27.29 0.0 - 38.6 U/mL 120.1 High   164.5 High  CM  150.7 High  CM  156.0 High  CM  234.4 High  CM  308.8 High  CM  351.0 High  CM   ?Comment: (NOTE)  ?[employer] Academic librarian [facility])  ?Values obtained with different assay methods or kits cannot be used  ?interchangeably. Results  cannot be interpreted as absolute evidence  ?of the presence or absence of malignant disease.  ?Performed At: [facility]  ?[address] [place], [place] [id number]  ?Rush Farmer MD [id number]

## 2021-12-12 ENCOUNTER — Telehealth: Payer: Self-pay

## 2021-12-12 ENCOUNTER — Ambulatory Visit
Admission: RE | Admit: 2021-12-12 | Discharge: 2021-12-12 | Disposition: A | Discharge status: Home / Self Care | Payer: Self-pay | Source: Ambulatory Visit | Attending: Internal Medicine | Admitting: Internal Medicine

## 2021-12-12 DIAGNOSIS — Z853 Personal history of malignant neoplasm of breast: Secondary | ICD-10-CM | POA: Diagnosis not present

## 2021-12-12 DIAGNOSIS — Z170 Estrogen receptor positive status [ER+]: Secondary | ICD-10-CM

## 2021-12-12 NOTE — Telephone Encounter (Signed)
Received a [employer] package form [place] that included an evaluation note and CD of NM bone scan whole body performed on 11/23/2021.  Will take CD to radiology department for upload to [facility] system and office note sent to be scanned in chart. ?

## 2021-12-14 ENCOUNTER — Encounter: Payer: Self-pay | Admitting: Internal Medicine

## 2021-12-14 DIAGNOSIS — C50912 Malignant neoplasm of unspecified site of left female breast: Secondary | ICD-10-CM | POA: Diagnosis not present

## 2021-12-14 DIAGNOSIS — C782 Secondary malignant neoplasm of pleura: Secondary | ICD-10-CM | POA: Diagnosis not present

## 2021-12-15 ENCOUNTER — Telehealth: Payer: Self-pay | Admitting: *Deleted

## 2021-12-15 DIAGNOSIS — C782 Secondary malignant neoplasm of pleura: Secondary | ICD-10-CM | POA: Diagnosis not present

## 2021-12-15 DIAGNOSIS — R748 Abnormal levels of other serum enzymes: Secondary | ICD-10-CM | POA: Diagnosis not present

## 2021-12-15 DIAGNOSIS — C50912 Malignant neoplasm of unspecified site of left female breast: Secondary | ICD-10-CM | POA: Diagnosis not present

## 2021-12-15 NOTE — Telephone Encounter (Signed)
Patient has called this morning wanting a response to her message she sent yesterday asking to have labs checked and questions whether she should continue taking her Kisquali after her lab results ?

## 2021-12-20 ENCOUNTER — Encounter: Payer: Self-pay | Admitting: Internal Medicine

## 2021-12-28 ENCOUNTER — Ambulatory Visit: Payer: 59 | Admitting: Internal Medicine

## 2021-12-28 ENCOUNTER — Other Ambulatory Visit: Payer: 59

## 2022-01-04 DIAGNOSIS — C782 Secondary malignant neoplasm of pleura: Secondary | ICD-10-CM | POA: Diagnosis not present

## 2022-01-04 DIAGNOSIS — C50912 Malignant neoplasm of unspecified site of left female breast: Secondary | ICD-10-CM | POA: Diagnosis not present

## 2022-01-04 DIAGNOSIS — R7401 Elevation of levels of liver transaminase levels: Secondary | ICD-10-CM | POA: Diagnosis not present

## 2022-01-04 DIAGNOSIS — C7951 Secondary malignant neoplasm of bone: Secondary | ICD-10-CM | POA: Diagnosis not present

## 2022-01-08 DIAGNOSIS — Z853 Personal history of malignant neoplasm of breast: Secondary | ICD-10-CM | POA: Diagnosis not present

## 2022-01-08 DIAGNOSIS — J90 Pleural effusion, not elsewhere classified: Secondary | ICD-10-CM | POA: Diagnosis not present

## 2022-01-08 DIAGNOSIS — C7951 Secondary malignant neoplasm of bone: Secondary | ICD-10-CM | POA: Diagnosis not present

## 2022-01-08 DIAGNOSIS — K760 Fatty (change of) liver, not elsewhere classified: Secondary | ICD-10-CM | POA: Diagnosis not present

## 2022-01-10 DIAGNOSIS — C782 Secondary malignant neoplasm of pleura: Secondary | ICD-10-CM | POA: Diagnosis not present

## 2022-01-10 DIAGNOSIS — R7401 Elevation of levels of liver transaminase levels: Secondary | ICD-10-CM | POA: Diagnosis not present

## 2022-01-10 DIAGNOSIS — C7951 Secondary malignant neoplasm of bone: Secondary | ICD-10-CM | POA: Diagnosis not present

## 2022-01-10 DIAGNOSIS — C50912 Malignant neoplasm of unspecified site of left female breast: Secondary | ICD-10-CM | POA: Diagnosis not present

## 2022-01-25 DIAGNOSIS — C50912 Malignant neoplasm of unspecified site of left female breast: Secondary | ICD-10-CM | POA: Diagnosis not present

## 2022-01-25 DIAGNOSIS — R7401 Elevation of levels of liver transaminase levels: Secondary | ICD-10-CM | POA: Diagnosis not present

## 2022-01-25 DIAGNOSIS — R748 Abnormal levels of other serum enzymes: Secondary | ICD-10-CM | POA: Diagnosis not present

## 2022-01-25 DIAGNOSIS — C782 Secondary malignant neoplasm of pleura: Secondary | ICD-10-CM | POA: Diagnosis not present

## 2022-01-27 DIAGNOSIS — U071 COVID-19: Secondary | ICD-10-CM | POA: Diagnosis not present

## 2022-01-27 DIAGNOSIS — R509 Fever, unspecified: Secondary | ICD-10-CM | POA: Diagnosis not present

## 2022-01-27 DIAGNOSIS — J90 Pleural effusion, not elsewhere classified: Secondary | ICD-10-CM | POA: Diagnosis not present

## 2022-01-27 DIAGNOSIS — J0100 Acute maxillary sinusitis, unspecified: Secondary | ICD-10-CM | POA: Diagnosis not present

## 2022-01-27 DIAGNOSIS — R059 Cough, unspecified: Secondary | ICD-10-CM | POA: Diagnosis not present

## 2022-01-27 DIAGNOSIS — Z03818 Encounter for observation for suspected exposure to other biological agents ruled out: Secondary | ICD-10-CM | POA: Diagnosis not present

## 2022-02-01 DIAGNOSIS — R748 Abnormal levels of other serum enzymes: Secondary | ICD-10-CM | POA: Diagnosis not present

## 2022-02-06 DIAGNOSIS — C782 Secondary malignant neoplasm of pleura: Secondary | ICD-10-CM | POA: Diagnosis not present

## 2022-02-06 DIAGNOSIS — C50912 Malignant neoplasm of unspecified site of left female breast: Secondary | ICD-10-CM | POA: Diagnosis not present

## 2022-02-06 DIAGNOSIS — R7401 Elevation of levels of liver transaminase levels: Secondary | ICD-10-CM | POA: Diagnosis not present

## 2022-02-20 DIAGNOSIS — L821 Other seborrheic keratosis: Secondary | ICD-10-CM | POA: Diagnosis not present

## 2022-02-20 DIAGNOSIS — D2271 Melanocytic nevi of right lower limb, including hip: Secondary | ICD-10-CM | POA: Diagnosis not present

## 2022-02-20 DIAGNOSIS — D225 Melanocytic nevi of trunk: Secondary | ICD-10-CM | POA: Diagnosis not present

## 2022-02-20 DIAGNOSIS — D2272 Melanocytic nevi of left lower limb, including hip: Secondary | ICD-10-CM | POA: Diagnosis not present

## 2022-02-20 DIAGNOSIS — D485 Neoplasm of uncertain behavior of skin: Secondary | ICD-10-CM | POA: Diagnosis not present

## 2022-02-20 DIAGNOSIS — D2261 Melanocytic nevi of right upper limb, including shoulder: Secondary | ICD-10-CM | POA: Diagnosis not present

## 2022-02-20 DIAGNOSIS — D2262 Melanocytic nevi of left upper limb, including shoulder: Secondary | ICD-10-CM | POA: Diagnosis not present

## 2022-02-20 DIAGNOSIS — L720 Epidermal cyst: Secondary | ICD-10-CM | POA: Diagnosis not present

## 2022-03-01 DIAGNOSIS — J9811 Atelectasis: Secondary | ICD-10-CM | POA: Diagnosis not present

## 2022-03-01 DIAGNOSIS — R7401 Elevation of levels of liver transaminase levels: Secondary | ICD-10-CM | POA: Diagnosis not present

## 2022-03-01 DIAGNOSIS — C782 Secondary malignant neoplasm of pleura: Secondary | ICD-10-CM | POA: Diagnosis not present

## 2022-03-01 DIAGNOSIS — C50912 Malignant neoplasm of unspecified site of left female breast: Secondary | ICD-10-CM | POA: Diagnosis not present

## 2022-03-01 DIAGNOSIS — J984 Other disorders of lung: Secondary | ICD-10-CM | POA: Diagnosis not present

## 2022-03-01 DIAGNOSIS — J90 Pleural effusion, not elsewhere classified: Secondary | ICD-10-CM | POA: Diagnosis not present

## 2022-03-07 DIAGNOSIS — C782 Secondary malignant neoplasm of pleura: Secondary | ICD-10-CM | POA: Diagnosis not present

## 2022-03-07 DIAGNOSIS — C50912 Malignant neoplasm of unspecified site of left female breast: Secondary | ICD-10-CM | POA: Diagnosis not present

## 2022-03-08 DIAGNOSIS — R748 Abnormal levels of other serum enzymes: Secondary | ICD-10-CM | POA: Diagnosis not present

## 2022-03-21 DIAGNOSIS — C782 Secondary malignant neoplasm of pleura: Secondary | ICD-10-CM | POA: Diagnosis not present

## 2022-03-21 DIAGNOSIS — R748 Abnormal levels of other serum enzymes: Secondary | ICD-10-CM | POA: Diagnosis not present

## 2022-03-21 DIAGNOSIS — C50912 Malignant neoplasm of unspecified site of left female breast: Secondary | ICD-10-CM | POA: Diagnosis not present

## 2022-04-05 DIAGNOSIS — C50912 Malignant neoplasm of unspecified site of left female breast: Secondary | ICD-10-CM | POA: Diagnosis not present

## 2022-04-05 DIAGNOSIS — C782 Secondary malignant neoplasm of pleura: Secondary | ICD-10-CM | POA: Diagnosis not present

## 2022-04-12 DIAGNOSIS — C7981 Secondary malignant neoplasm of breast: Secondary | ICD-10-CM | POA: Diagnosis not present

## 2022-04-12 DIAGNOSIS — C782 Secondary malignant neoplasm of pleura: Secondary | ICD-10-CM | POA: Diagnosis not present

## 2022-04-12 DIAGNOSIS — C7951 Secondary malignant neoplasm of bone: Secondary | ICD-10-CM | POA: Diagnosis not present

## 2022-04-12 DIAGNOSIS — C50912 Malignant neoplasm of unspecified site of left female breast: Secondary | ICD-10-CM | POA: Diagnosis not present

## 2022-04-19 DIAGNOSIS — R748 Abnormal levels of other serum enzymes: Secondary | ICD-10-CM | POA: Diagnosis not present

## 2022-05-11 DIAGNOSIS — C50912 Malignant neoplasm of unspecified site of left female breast: Secondary | ICD-10-CM | POA: Diagnosis not present

## 2022-05-11 DIAGNOSIS — C782 Secondary malignant neoplasm of pleura: Secondary | ICD-10-CM | POA: Diagnosis not present

## 2022-06-15 DIAGNOSIS — C782 Secondary malignant neoplasm of pleura: Secondary | ICD-10-CM | POA: Diagnosis not present

## 2022-06-15 DIAGNOSIS — C50912 Malignant neoplasm of unspecified site of left female breast: Secondary | ICD-10-CM | POA: Diagnosis not present

## 2022-06-15 DIAGNOSIS — C7951 Secondary malignant neoplasm of bone: Secondary | ICD-10-CM | POA: Diagnosis not present

## 2022-06-19 DIAGNOSIS — C7951 Secondary malignant neoplasm of bone: Secondary | ICD-10-CM | POA: Diagnosis not present

## 2022-06-19 DIAGNOSIS — C782 Secondary malignant neoplasm of pleura: Secondary | ICD-10-CM | POA: Diagnosis not present

## 2022-06-19 DIAGNOSIS — C50912 Malignant neoplasm of unspecified site of left female breast: Secondary | ICD-10-CM | POA: Diagnosis not present

## 2022-06-21 DIAGNOSIS — C50912 Malignant neoplasm of unspecified site of left female breast: Secondary | ICD-10-CM | POA: Diagnosis not present

## 2022-06-21 DIAGNOSIS — C782 Secondary malignant neoplasm of pleura: Secondary | ICD-10-CM | POA: Diagnosis not present

## 2022-07-30 DIAGNOSIS — C50912 Malignant neoplasm of unspecified site of left female breast: Secondary | ICD-10-CM | POA: Diagnosis not present

## 2022-07-30 DIAGNOSIS — C782 Secondary malignant neoplasm of pleura: Secondary | ICD-10-CM | POA: Diagnosis not present

## 2022-07-30 DIAGNOSIS — C7951 Secondary malignant neoplasm of bone: Secondary | ICD-10-CM | POA: Diagnosis not present

## 2022-09-28 DIAGNOSIS — C782 Secondary malignant neoplasm of pleura: Secondary | ICD-10-CM | POA: Diagnosis not present

## 2022-09-28 DIAGNOSIS — C50912 Malignant neoplasm of unspecified site of left female breast: Secondary | ICD-10-CM | POA: Diagnosis not present

## 2022-09-28 DIAGNOSIS — C7951 Secondary malignant neoplasm of bone: Secondary | ICD-10-CM | POA: Diagnosis not present

## 2022-10-25 DIAGNOSIS — C7951 Secondary malignant neoplasm of bone: Secondary | ICD-10-CM | POA: Diagnosis not present

## 2022-10-25 DIAGNOSIS — Z79811 Long term (current) use of aromatase inhibitors: Secondary | ICD-10-CM | POA: Diagnosis not present

## 2022-10-25 DIAGNOSIS — C782 Secondary malignant neoplasm of pleura: Secondary | ICD-10-CM | POA: Diagnosis not present

## 2022-10-25 DIAGNOSIS — C50912 Malignant neoplasm of unspecified site of left female breast: Secondary | ICD-10-CM | POA: Diagnosis not present

## 2022-10-25 DIAGNOSIS — Z23 Encounter for immunization: Secondary | ICD-10-CM | POA: Diagnosis not present

## 2022-11-06 DIAGNOSIS — H5203 Hypermetropia, bilateral: Secondary | ICD-10-CM | POA: Diagnosis not present

## 2022-11-22 IMAGING — CT CT CHEST W/ CM
2 of 4 series · 11 of 36 positions shown, 13 images · IV contrast (iopamidol)
Comparison: September 09, 2007.  Abdominal imaging from 7784.
COMPARISON: September 09, 2007.  Abdominal imaging from 7784.

Addendum:
CLINICAL DATA: Pneumonia due to G7ASS-M5 infection in [REDACTED].
LEFT-sided chest pain and pain between ribs and iliac crest for 3
weeks, also with history of LEFT-sided breast cancer. 50-year-old
female

EXAM:
CT CHEST WITH CONTRAST
TECHNIQUE: Multidetector CT imaging of the chest was performed during
intravenous contrast administration.
CONTRAST:  75mL 1YDJ7K-9DD IOPAMIDOL (1YDJ7K-9DD) INJECTION 61%

[Series 2: chest 2.00 br40 s3 · axial · 0.52mm/px · z∈[+1551,+1835]mm · 8 of 168 slices shown, 10 images (1 of 2)]
[im 13/168  mediastinal]
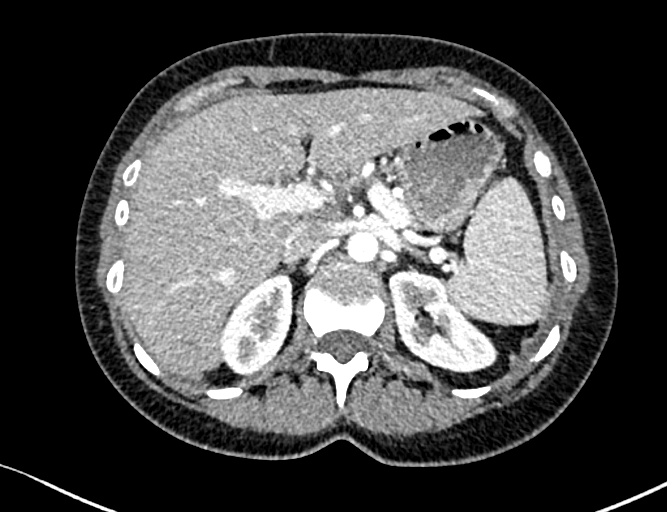
[im 13/168  lung]
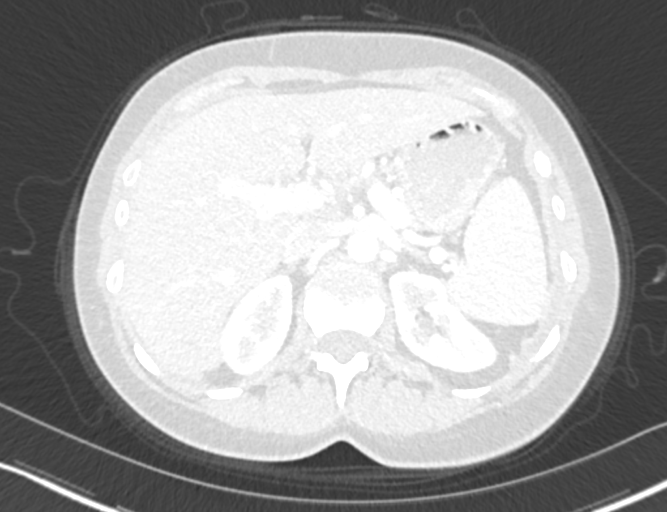
[im 39/168  lung]
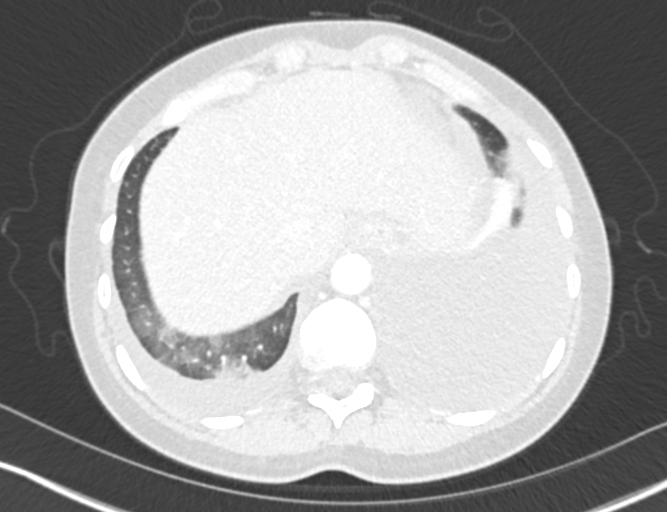
[im 52/168  lung]
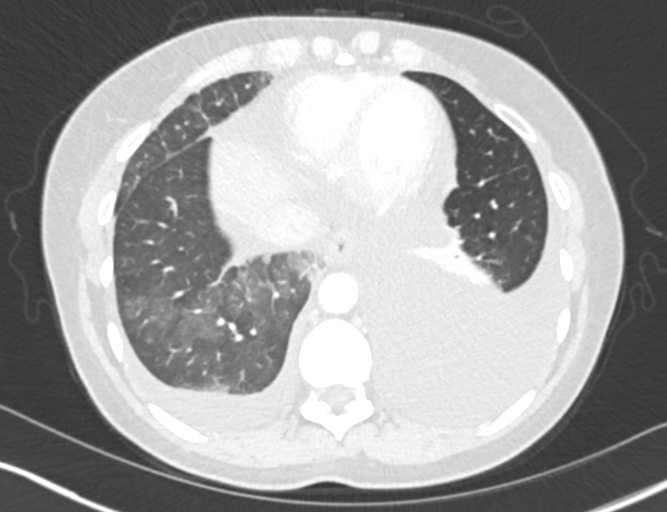
[im 78/168  lung]
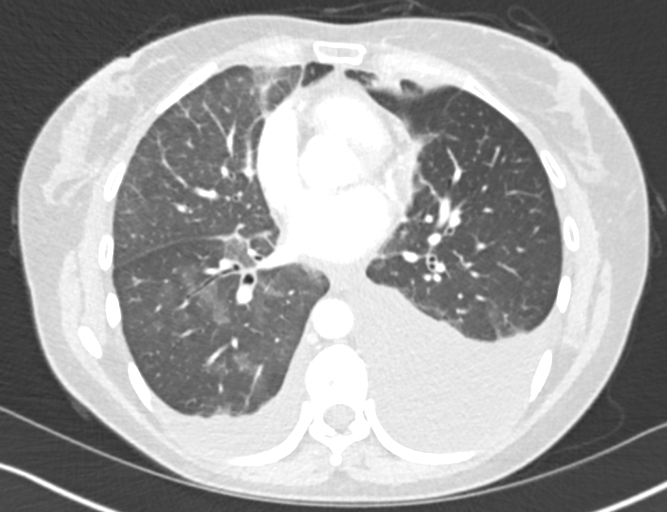
[im 90/168  mediastinal]
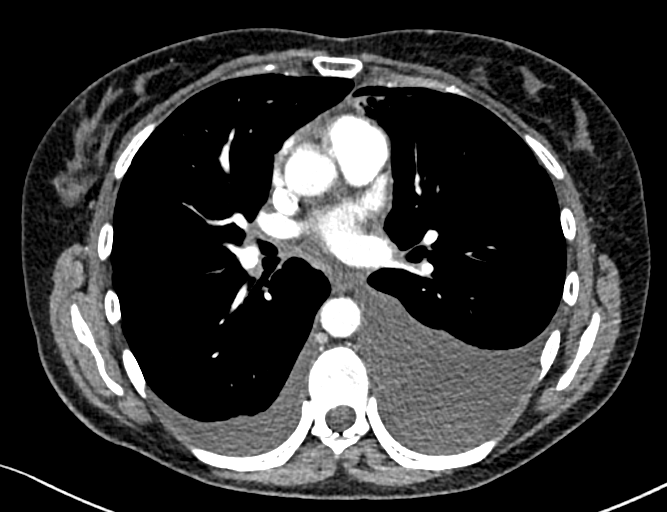
[im 90/168  lung]
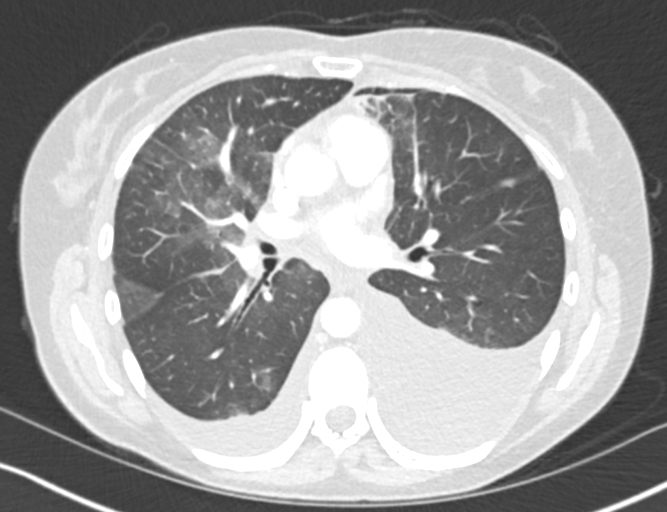
[im 116/168  lung]
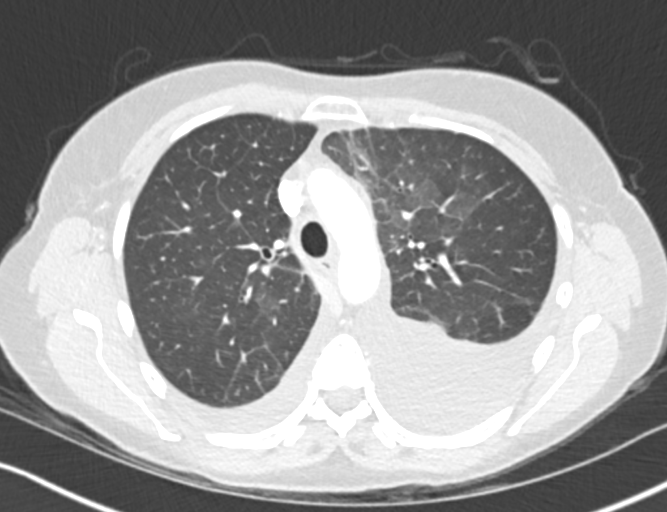
[im 129/168  lung]
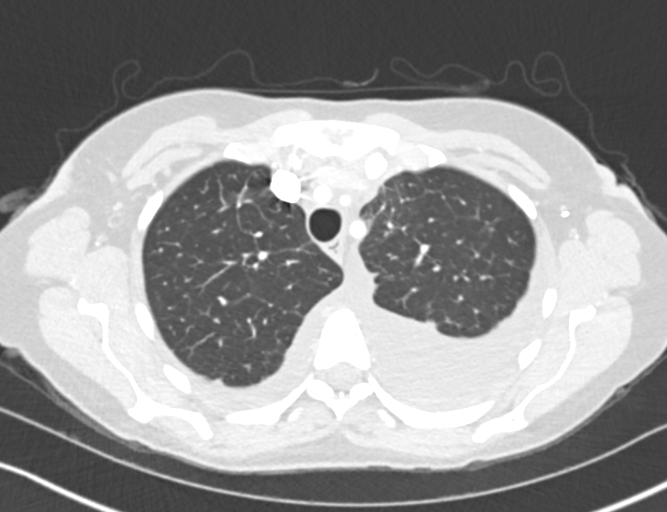
[im 155/168  lung]
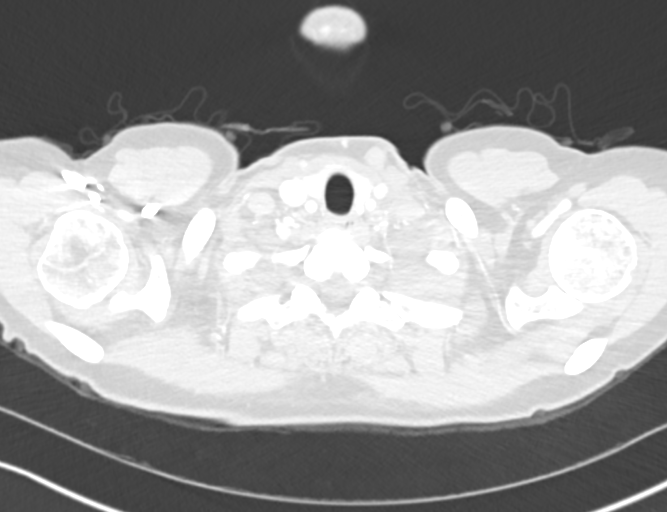

[Series 4: chest 2.00 br40 s3 · coronal · 0.66mm/px · 3 of 133 slices shown (2 of 2)]
[im 27/133  lung]
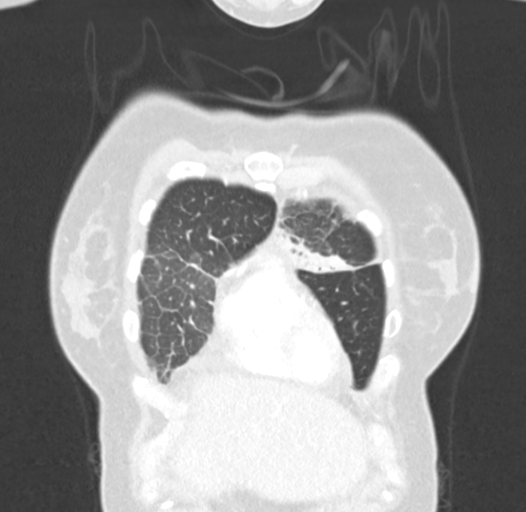
[im 53/133  lung]
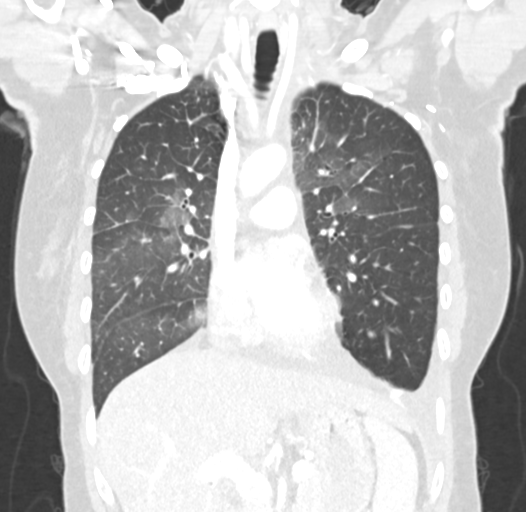
[im 80/133  lung]
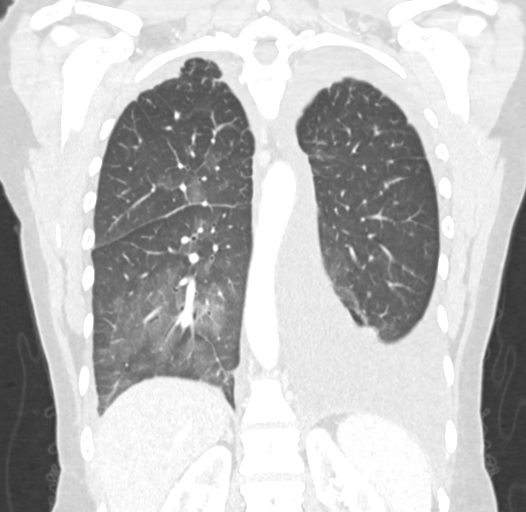

[11 of 36 positions shown; findings below may reference images not displayed]

FINDINGS: Cardiovascular: Normal caliber of the thoracic aorta. Heart size is
normal. No pericardial effusion. Central pulmonary vasculature is
unremarkable.

Mediastinum/Nodes: Mildly patulous esophagus. Thoracic inlet
structures are normal.

Post LEFT-sided axillary dissection. No adenopathy in the chest.
Mild infiltration of the fat in the LEFT superior mediastinum.

Lungs/Pleura: Patchy areas of ground-glass and septal thickening
with bandlike consolidative process over the LEFT heart border.
Large dependent LEFT-sided pleural effusion. Small RIGHT-sided
pleural effusion. Ground-glass and septal thickening most pronounced
at the LEFT lung base. No frank bronchiectatic changes.

Upper Abdomen: Suspected hepatic steatosis. Liver is incompletely
imaged. Area of low attenuation in the posterior RIGHT hepatic lobe
not present in 1004 but not significantly changed compared to the
7784. Post cholecystectomy. No acute upper abdominal process.

Musculoskeletal: No acute musculoskeletal finding. New sclerotic
focus at the T1 level (image [DATE]) 9 mm

New sclerotic focus go to end of sentence (image 84/4) measuring 12
mm in the T12 level.

New sclerotic focus in the T8 level (image 103/4 8 mm. Other
scattered subtle small foci in the spine
IMPRESSION: 1. Patchy areas of ground-glass and septal thickening may represent
sequela of previous COVID infection. Ongoing or recurrent infection
is not excluded. No significant signs of post inflammatory fibrosis.
2. Large LEFT and small RIGHT-sided pleural effusion. Significance
uncertain in this patient with history of breast cancer. No overt
nodularity in the pleural space. Would also correlate with any heart
failure or volume overload.
3. Mild mediastinal fat stranding may reflect post treatment changes
or could be related to ongoing inflammation in the chest.
4. Bandlike area of consolidative changes along the pleural surface
in the LEFT upper lobe may be post infectious or inflammatory or
related to prior radiation. Attention on follow-up.
5. New sclerotic foci in the spine, suspicious for metastatic
disease given history of breast cancer, consider follow-up PET or
bone scan as warranted.
6. Post LEFT-sided axillary dissection.
7. Aortic atherosclerosis.

ADDENDUM:
In addition to the findings outlined in the initial report
interlobular septal thickening raises the question of lymphangitic
tumor particularly in the RIGHT lower lobe and in the RIGHT middle
lobe, for instance on image 96 of series 8.

These results will be called to the ordering clinician or
representative by the Radiologist Assistant, and communication
documented in the PACS or [REDACTED].

*** End of Addendum ***
FINDINGS: Cardiovascular: Normal caliber of the thoracic aorta. Heart size is
normal. No pericardial effusion. Central pulmonary vasculature is
unremarkable.

Mediastinum/Nodes: Mildly patulous esophagus. Thoracic inlet
structures are normal.

Post LEFT-sided axillary dissection. No adenopathy in the chest.
Mild infiltration of the fat in the LEFT superior mediastinum.

Lungs/Pleura: Patchy areas of ground-glass and septal thickening
with bandlike consolidative process over the LEFT heart border.
Large dependent LEFT-sided pleural effusion. Small RIGHT-sided
pleural effusion. Ground-glass and septal thickening most pronounced
at the LEFT lung base. No frank bronchiectatic changes.

Upper Abdomen: Suspected hepatic steatosis. Liver is incompletely
imaged. Area of low attenuation in the posterior RIGHT hepatic lobe
not present in 1004 but not significantly changed compared to the
7784. Post cholecystectomy. No acute upper abdominal process.

Musculoskeletal: No acute musculoskeletal finding. New sclerotic
focus at the T1 level (image [DATE]) 9 mm

New sclerotic focus go to end of sentence (image 84/4) measuring 12
mm in the T12 level.

New sclerotic focus in the T8 level (image 103/4 8 mm. Other
scattered subtle small foci in the spine
IMPRESSION: 1. Patchy areas of ground-glass and septal thickening may represent
sequela of previous COVID infection. Ongoing or recurrent infection
is not excluded. No significant signs of post inflammatory fibrosis.
2. Large LEFT and small RIGHT-sided pleural effusion. Significance
uncertain in this patient with history of breast cancer. No overt
nodularity in the pleural space. Would also correlate with any heart
failure or volume overload.
3. Mild mediastinal fat stranding may reflect post treatment changes
or could be related to ongoing inflammation in the chest.
4. Bandlike area of consolidative changes along the pleural surface
in the LEFT upper lobe may be post infectious or inflammatory or
related to prior radiation. Attention on follow-up.
5. New sclerotic foci in the spine, suspicious for metastatic
disease given history of breast cancer, consider follow-up PET or
bone scan as warranted.
6. Post LEFT-sided axillary dissection.
7. Aortic atherosclerosis.

## 2023-01-02 DIAGNOSIS — C50919 Malignant neoplasm of unspecified site of unspecified female breast: Secondary | ICD-10-CM | POA: Diagnosis not present

## 2023-01-14 DIAGNOSIS — C50919 Malignant neoplasm of unspecified site of unspecified female breast: Secondary | ICD-10-CM | POA: Diagnosis not present

## 2023-02-28 DIAGNOSIS — D2272 Melanocytic nevi of left lower limb, including hip: Secondary | ICD-10-CM | POA: Diagnosis not present

## 2023-02-28 DIAGNOSIS — D2261 Melanocytic nevi of right upper limb, including shoulder: Secondary | ICD-10-CM | POA: Diagnosis not present

## 2023-02-28 DIAGNOSIS — D225 Melanocytic nevi of trunk: Secondary | ICD-10-CM | POA: Diagnosis not present

## 2023-02-28 DIAGNOSIS — D2271 Melanocytic nevi of right lower limb, including hip: Secondary | ICD-10-CM | POA: Diagnosis not present

## 2023-02-28 DIAGNOSIS — D2262 Melanocytic nevi of left upper limb, including shoulder: Secondary | ICD-10-CM | POA: Diagnosis not present

## 2023-02-28 DIAGNOSIS — L821 Other seborrheic keratosis: Secondary | ICD-10-CM | POA: Diagnosis not present

## 2023-04-04 DIAGNOSIS — C50912 Malignant neoplasm of unspecified site of left female breast: Secondary | ICD-10-CM | POA: Diagnosis not present

## 2023-04-04 DIAGNOSIS — C782 Secondary malignant neoplasm of pleura: Secondary | ICD-10-CM | POA: Diagnosis not present

## 2023-04-05 ENCOUNTER — Ambulatory Visit
Admission: RE | Admit: 2023-04-05 | Discharge: 2023-04-05 | Disposition: A | Discharge status: Home / Self Care | Payer: 59 | Source: Ambulatory Visit | Attending: Student | Admitting: Student

## 2023-04-05 ENCOUNTER — Other Ambulatory Visit: Payer: Self-pay | Admitting: Internal Medicine

## 2023-04-05 ENCOUNTER — Ambulatory Visit
Admission: RE | Admit: 2023-04-05 | Discharge: 2023-04-05 | Disposition: A | Discharge status: Home / Self Care | Payer: 59 | Source: Ambulatory Visit | Attending: Internal Medicine | Admitting: Internal Medicine

## 2023-04-05 DIAGNOSIS — C782 Secondary malignant neoplasm of pleura: Secondary | ICD-10-CM | POA: Insufficient documentation

## 2023-04-05 DIAGNOSIS — C50912 Malignant neoplasm of unspecified site of left female breast: Secondary | ICD-10-CM

## 2023-04-05 DIAGNOSIS — C50919 Malignant neoplasm of unspecified site of unspecified female breast: Secondary | ICD-10-CM | POA: Diagnosis not present

## 2023-04-05 DIAGNOSIS — Z170 Estrogen receptor positive status [ER+]: Secondary | ICD-10-CM | POA: Insufficient documentation

## 2023-04-05 DIAGNOSIS — J929 Pleural plaque without asbestos: Secondary | ICD-10-CM | POA: Diagnosis not present

## 2023-04-05 DIAGNOSIS — J90 Pleural effusion, not elsewhere classified: Secondary | ICD-10-CM | POA: Diagnosis not present

## 2023-04-05 DIAGNOSIS — R918 Other nonspecific abnormal finding of lung field: Secondary | ICD-10-CM | POA: Diagnosis not present

## 2023-04-05 DIAGNOSIS — Z48813 Encounter for surgical aftercare following surgery on the respiratory system: Secondary | ICD-10-CM | POA: Diagnosis not present

## 2023-04-05 DIAGNOSIS — E538 Deficiency of other specified B group vitamins: Secondary | ICD-10-CM | POA: Diagnosis not present

## 2023-04-05 MED ORDER — LIDOCAINE HCL (PF) 1 % IJ SOLN: 10.0000 mL | Freq: Once | INTRAMUSCULAR | Status: DC

## 2023-04-05 NOTE — Procedures (Signed)
PROCEDURE SUMMARY:  Successful US guided right thoracentesis. Yielded 800 mL of yellow fluid. Pt tolerated procedure fair. Complained of feeling clamy. States she has had difficulty with thoracentesis tolerance in the past. No immediate complications.  Specimen was sent for labs. CXR ordered.  EBL < 5 mL  Hoyt Koch PA-C 04/05/2023 3:08 PM

## 2023-04-10 ENCOUNTER — Other Ambulatory Visit: Payer: Self-pay | Admitting: Student

## 2023-04-10 ENCOUNTER — Other Ambulatory Visit: Payer: Self-pay | Admitting: Emergency Medicine

## 2023-04-10 ENCOUNTER — Ambulatory Visit
Admission: RE | Admit: 2023-04-10 | Discharge: 2023-04-10 | Disposition: A | Discharge status: Home / Self Care | Payer: 59 | Source: Ambulatory Visit | Attending: Internal Medicine | Admitting: Internal Medicine

## 2023-04-10 ENCOUNTER — Other Ambulatory Visit: Payer: Self-pay | Admitting: Internal Medicine

## 2023-04-10 ENCOUNTER — Ambulatory Visit: Admission: RE | Admit: 2023-04-10 | Payer: 59 | Source: Ambulatory Visit

## 2023-04-10 DIAGNOSIS — J90 Pleural effusion, not elsewhere classified: Secondary | ICD-10-CM

## 2023-04-10 DIAGNOSIS — C799 Secondary malignant neoplasm of unspecified site: Secondary | ICD-10-CM

## 2023-04-10 DIAGNOSIS — C50912 Malignant neoplasm of unspecified site of left female breast: Secondary | ICD-10-CM | POA: Insufficient documentation

## 2023-04-10 DIAGNOSIS — Z48813 Encounter for surgical aftercare following surgery on the respiratory system: Secondary | ICD-10-CM | POA: Diagnosis not present

## 2023-04-10 DIAGNOSIS — Z853 Personal history of malignant neoplasm of breast: Secondary | ICD-10-CM | POA: Insufficient documentation

## 2023-04-10 DIAGNOSIS — J929 Pleural plaque without asbestos: Secondary | ICD-10-CM | POA: Diagnosis not present

## 2023-04-10 DIAGNOSIS — Z9889 Other specified postprocedural states: Secondary | ICD-10-CM | POA: Insufficient documentation

## 2023-04-10 DIAGNOSIS — C50919 Malignant neoplasm of unspecified site of unspecified female breast: Secondary | ICD-10-CM | POA: Diagnosis not present

## 2023-04-10 DIAGNOSIS — Z170 Estrogen receptor positive status [ER+]: Secondary | ICD-10-CM | POA: Diagnosis not present

## 2023-04-10 MED ORDER — LIDOCAINE HCL (PF) 1 % IJ SOLN
10.0000 mL | Freq: Once | INTRAMUSCULAR | Status: AC
  Administered 2023-04-10: 10 mL via INTRADERMAL
  Filled 2023-04-10: qty 10

## 2023-04-10 NOTE — Procedures (Signed)
PROCEDURE SUMMARY:  Successful image-guided left thoracentesis. Yielded 600 mL of clear yellow fluid. Pt tolerated procedure fairly. She asked that procedure be stopped after 600 mL due to feeling clammy. She states she has had difficulty with tolerating thoracentesis in the past.  No immediate complications. EBL = trace   Specimen was sent for labs. CXR ordered.  Please see imaging section of Epic for full dictation.  Kennieth Francois PA-C 04/10/2023 12:19 PM

## 2023-04-11 ENCOUNTER — Encounter: Payer: Self-pay | Admitting: Emergency Medicine

## 2023-04-12 ENCOUNTER — Telehealth: Payer: Self-pay

## 2023-04-12 MED ORDER — PREDNISONE 50 MG PO TABS: ORAL_TABLET | ORAL | 0 refills | Status: DC

## 2023-04-12 MED ORDER — DIPHENHYDRAMINE HCL 50 MG PO TABS: ORAL_TABLET | ORAL | 0 refills | Status: DC

## 2023-04-12 NOTE — Telephone Encounter (Signed)
Phone call to patient to review instructions for 13 hr prep for CT w/ contrast on 04/16/23 at 11:30 am. Prescription called into [facility]. Pt aware and verbalized understanding of instructions. Pt to take 50 mg of prednisone on 04/15/23 at 10:30 pm, 50 mg of prednisone on 04/16/23 at 4:30 am, and 50 mg of prednisone on 04/16/23 at 10:30 am. Pt is also to take 50 mg of benadryl on 04/16/23 at 10:30 am. Please call [phone] with any questions.

## 2023-04-15 DIAGNOSIS — R053 Chronic cough: Secondary | ICD-10-CM | POA: Diagnosis not present

## 2023-04-16 ENCOUNTER — Inpatient Hospital Stay: Admission: RE | Admit: 2023-04-16 | Payer: 59 | Source: Ambulatory Visit

## 2023-04-16 ENCOUNTER — Other Ambulatory Visit: Payer: Self-pay | Admitting: Pulmonary Disease

## 2023-04-16 DIAGNOSIS — R7989 Other specified abnormal findings of blood chemistry: Secondary | ICD-10-CM

## 2023-04-17 ENCOUNTER — Ambulatory Visit
Admission: RE | Admit: 2023-04-17 | Discharge: 2023-04-17 | Disposition: A | Discharge status: Home / Self Care | Payer: 59 | Source: Ambulatory Visit | Attending: Pulmonary Disease | Admitting: Pulmonary Disease

## 2023-04-17 DIAGNOSIS — R7989 Other specified abnormal findings of blood chemistry: Secondary | ICD-10-CM | POA: Insufficient documentation

## 2023-04-17 DIAGNOSIS — R918 Other nonspecific abnormal finding of lung field: Secondary | ICD-10-CM | POA: Diagnosis not present

## 2023-04-17 DIAGNOSIS — K769 Liver disease, unspecified: Secondary | ICD-10-CM | POA: Diagnosis not present

## 2023-04-17 DIAGNOSIS — J90 Pleural effusion, not elsewhere classified: Secondary | ICD-10-CM | POA: Diagnosis not present

## 2023-04-17 DIAGNOSIS — I3139 Other pericardial effusion (noninflammatory): Secondary | ICD-10-CM | POA: Diagnosis not present

## 2023-04-17 MED ORDER — IOHEXOL 350 MG/ML SOLN
75.0000 mL | Freq: Once | INTRAVENOUS | Status: AC | PRN
  Administered 2023-04-17: 75 mL via INTRAVENOUS

## 2023-04-17 MED ORDER — DIPHENHYDRAMINE HCL 50 MG/ML IJ SOLN
50.0000 mg | Freq: Once | INTRAMUSCULAR | Status: DC
  Filled 2023-04-17: qty 1

## 2023-04-17 MED ORDER — DIPHENHYDRAMINE HCL 50 MG PO CAPS
50.0000 mg | ORAL_CAPSULE | Freq: Once | ORAL | Status: DC
  Filled 2023-04-17: qty 1

## 2023-04-17 MED ORDER — PREDNISONE 50 MG PO TABS
50.0000 mg | ORAL_TABLET | Freq: Four times a day (QID) | ORAL | Status: DC
  Filled 2023-04-17: qty 1

## 2023-04-19 ENCOUNTER — Other Ambulatory Visit: Payer: Self-pay | Admitting: Pulmonary Disease

## 2023-04-19 DIAGNOSIS — J910 Malignant pleural effusion: Secondary | ICD-10-CM

## 2023-04-23 ENCOUNTER — Ambulatory Visit: Payer: 59

## 2023-04-29 DIAGNOSIS — C787 Secondary malignant neoplasm of liver and intrahepatic bile duct: Secondary | ICD-10-CM | POA: Diagnosis not present

## 2023-04-29 DIAGNOSIS — C7931 Secondary malignant neoplasm of brain: Secondary | ICD-10-CM | POA: Diagnosis not present

## 2023-04-29 DIAGNOSIS — N289 Disorder of kidney and ureter, unspecified: Secondary | ICD-10-CM | POA: Diagnosis not present

## 2023-04-29 DIAGNOSIS — J90 Pleural effusion, not elsewhere classified: Secondary | ICD-10-CM | POA: Diagnosis not present

## 2023-04-29 DIAGNOSIS — C50912 Malignant neoplasm of unspecified site of left female breast: Secondary | ICD-10-CM | POA: Diagnosis not present

## 2023-04-29 DIAGNOSIS — C7951 Secondary malignant neoplasm of bone: Secondary | ICD-10-CM | POA: Diagnosis not present

## 2023-04-29 DIAGNOSIS — C782 Secondary malignant neoplasm of pleura: Secondary | ICD-10-CM | POA: Diagnosis not present

## 2023-04-30 ENCOUNTER — Other Ambulatory Visit: Payer: Self-pay | Admitting: Pulmonary Disease

## 2023-04-30 DIAGNOSIS — C782 Secondary malignant neoplasm of pleura: Secondary | ICD-10-CM | POA: Diagnosis not present

## 2023-04-30 DIAGNOSIS — Z853 Personal history of malignant neoplasm of breast: Secondary | ICD-10-CM | POA: Diagnosis not present

## 2023-04-30 DIAGNOSIS — J910 Malignant pleural effusion: Secondary | ICD-10-CM

## 2023-04-30 DIAGNOSIS — C7951 Secondary malignant neoplasm of bone: Secondary | ICD-10-CM | POA: Diagnosis not present

## 2023-04-30 DIAGNOSIS — C50912 Malignant neoplasm of unspecified site of left female breast: Secondary | ICD-10-CM | POA: Diagnosis not present

## 2023-04-30 DIAGNOSIS — J90 Pleural effusion, not elsewhere classified: Secondary | ICD-10-CM | POA: Diagnosis not present

## 2023-05-01 ENCOUNTER — Ambulatory Visit: Admission: RE | Admit: 2023-05-01 | Payer: 59 | Source: Ambulatory Visit

## 2023-05-01 ENCOUNTER — Ambulatory Visit
Admission: RE | Admit: 2023-05-01 | Discharge: 2023-05-01 | Disposition: A | Discharge status: Home / Self Care | Payer: 59 | Source: Ambulatory Visit | Attending: Student | Admitting: Student

## 2023-05-01 ENCOUNTER — Other Ambulatory Visit: Payer: Self-pay | Admitting: Student

## 2023-05-01 DIAGNOSIS — J910 Malignant pleural effusion: Secondary | ICD-10-CM

## 2023-05-01 DIAGNOSIS — Z9889 Other specified postprocedural states: Secondary | ICD-10-CM

## 2023-05-01 DIAGNOSIS — J90 Pleural effusion, not elsewhere classified: Secondary | ICD-10-CM | POA: Diagnosis not present

## 2023-05-01 DIAGNOSIS — R918 Other nonspecific abnormal finding of lung field: Secondary | ICD-10-CM | POA: Diagnosis not present

## 2023-05-01 DIAGNOSIS — J984 Other disorders of lung: Secondary | ICD-10-CM | POA: Insufficient documentation

## 2023-05-01 DIAGNOSIS — Z853 Personal history of malignant neoplasm of breast: Secondary | ICD-10-CM | POA: Insufficient documentation

## 2023-05-01 MED ORDER — LIDOCAINE HCL (PF) 1 % IJ SOLN
10.0000 mL | Freq: Once | INTRAMUSCULAR | Status: AC
  Administered 2023-05-01: 10 mL via INTRADERMAL
  Filled 2023-05-01: qty 10

## 2023-05-01 NOTE — Procedures (Signed)
PROCEDURE SUMMARY:  Successful US guided right thoracentesis. Yielded 1.1 L of clear yellow fluid. Pt tolerated procedure well. No immediate complications.  Specimen not sent for labs. CXR ordered; no post-procedure pneumothorax identified.   EBL < 2 mL  Mickie Kay, NP 05/01/2023 11:21 AM

## 2023-05-03 ENCOUNTER — Other Ambulatory Visit: Payer: Self-pay | Admitting: Pulmonary Disease

## 2023-05-03 ENCOUNTER — Ambulatory Visit
Admission: RE | Admit: 2023-05-03 | Discharge: 2023-05-03 | Disposition: A | Discharge status: Home / Self Care | Payer: 59 | Source: Ambulatory Visit | Attending: Student | Admitting: Student

## 2023-05-03 ENCOUNTER — Ambulatory Visit
Admission: RE | Admit: 2023-05-03 | Discharge: 2023-05-03 | Disposition: A | Discharge status: Home / Self Care | Payer: 59 | Source: Ambulatory Visit | Attending: Pulmonary Disease | Admitting: Pulmonary Disease

## 2023-05-03 DIAGNOSIS — Z853 Personal history of malignant neoplasm of breast: Secondary | ICD-10-CM | POA: Diagnosis not present

## 2023-05-03 DIAGNOSIS — J90 Pleural effusion, not elsewhere classified: Secondary | ICD-10-CM | POA: Diagnosis not present

## 2023-05-03 DIAGNOSIS — J910 Malignant pleural effusion: Secondary | ICD-10-CM

## 2023-05-03 DIAGNOSIS — J9811 Atelectasis: Secondary | ICD-10-CM | POA: Diagnosis not present

## 2023-05-03 DIAGNOSIS — Z48813 Encounter for surgical aftercare following surgery on the respiratory system: Secondary | ICD-10-CM | POA: Diagnosis not present

## 2023-05-03 DIAGNOSIS — C50919 Malignant neoplasm of unspecified site of unspecified female breast: Secondary | ICD-10-CM | POA: Diagnosis not present

## 2023-05-03 MED ORDER — LIDOCAINE HCL (PF) 1 % IJ SOLN
5.0000 mL | Freq: Once | INTRAMUSCULAR | Status: AC
  Administered 2023-05-03: 5 mL via INTRADERMAL
  Filled 2023-05-03: qty 5

## 2023-05-03 NOTE — Procedures (Signed)
PROCEDURE SUMMARY:  Successful image-guided left thoracentesis. Yielded 400 mL of clear yellow fluid. Pt tolerated procedure well. No immediate complications. EBL = trace   Specimen was not sent for labs. CXR ordered.  Please see imaging section of Epic for full dictation.  Kennieth Francois PA-C 05/03/2023 3:32 PM

## 2023-05-14 DIAGNOSIS — C50912 Malignant neoplasm of unspecified site of left female breast: Secondary | ICD-10-CM | POA: Diagnosis not present

## 2023-05-14 DIAGNOSIS — C782 Secondary malignant neoplasm of pleura: Secondary | ICD-10-CM | POA: Diagnosis not present

## 2023-05-14 DIAGNOSIS — J90 Pleural effusion, not elsewhere classified: Secondary | ICD-10-CM | POA: Diagnosis not present

## 2023-05-14 DIAGNOSIS — Z853 Personal history of malignant neoplasm of breast: Secondary | ICD-10-CM | POA: Diagnosis not present

## 2023-05-14 DIAGNOSIS — C7951 Secondary malignant neoplasm of bone: Secondary | ICD-10-CM | POA: Diagnosis not present

## 2023-05-22 DIAGNOSIS — J90 Pleural effusion, not elsewhere classified: Secondary | ICD-10-CM | POA: Diagnosis not present

## 2023-05-28 DIAGNOSIS — C7951 Secondary malignant neoplasm of bone: Secondary | ICD-10-CM | POA: Diagnosis not present

## 2023-05-28 DIAGNOSIS — Z8616 Personal history of COVID-19: Secondary | ICD-10-CM | POA: Diagnosis not present

## 2023-05-28 DIAGNOSIS — C50812 Malignant neoplasm of overlapping sites of left female breast: Secondary | ICD-10-CM | POA: Diagnosis not present

## 2023-05-28 DIAGNOSIS — J90 Pleural effusion, not elsewhere classified: Secondary | ICD-10-CM | POA: Diagnosis not present

## 2023-05-28 DIAGNOSIS — Z853 Personal history of malignant neoplasm of breast: Secondary | ICD-10-CM | POA: Diagnosis not present

## 2023-05-28 DIAGNOSIS — Z79899 Other long term (current) drug therapy: Secondary | ICD-10-CM | POA: Diagnosis not present

## 2023-05-28 DIAGNOSIS — Z23 Encounter for immunization: Secondary | ICD-10-CM | POA: Diagnosis not present

## 2023-05-28 DIAGNOSIS — C782 Secondary malignant neoplasm of pleura: Secondary | ICD-10-CM | POA: Diagnosis not present

## 2023-05-28 DIAGNOSIS — C50912 Malignant neoplasm of unspecified site of left female breast: Secondary | ICD-10-CM | POA: Diagnosis not present

## 2023-06-10 DIAGNOSIS — C782 Secondary malignant neoplasm of pleura: Secondary | ICD-10-CM | POA: Diagnosis not present

## 2023-06-10 DIAGNOSIS — C7951 Secondary malignant neoplasm of bone: Secondary | ICD-10-CM | POA: Diagnosis not present

## 2023-06-10 DIAGNOSIS — C50912 Malignant neoplasm of unspecified site of left female breast: Secondary | ICD-10-CM | POA: Diagnosis not present

## 2023-06-24 DIAGNOSIS — J40 Bronchitis, not specified as acute or chronic: Secondary | ICD-10-CM | POA: Diagnosis not present

## 2023-06-24 DIAGNOSIS — J45902 Unspecified asthma with status asthmaticus: Secondary | ICD-10-CM | POA: Diagnosis not present

## 2023-06-24 DIAGNOSIS — Z03818 Encounter for observation for suspected exposure to other biological agents ruled out: Secondary | ICD-10-CM | POA: Diagnosis not present

## 2023-06-24 DIAGNOSIS — B349 Viral infection, unspecified: Secondary | ICD-10-CM | POA: Diagnosis not present

## 2023-06-27 DIAGNOSIS — Z853 Personal history of malignant neoplasm of breast: Secondary | ICD-10-CM | POA: Diagnosis not present

## 2023-06-27 DIAGNOSIS — C7951 Secondary malignant neoplasm of bone: Secondary | ICD-10-CM | POA: Diagnosis not present

## 2023-06-27 DIAGNOSIS — C50912 Malignant neoplasm of unspecified site of left female breast: Secondary | ICD-10-CM | POA: Diagnosis not present

## 2023-06-27 DIAGNOSIS — Z79899 Other long term (current) drug therapy: Secondary | ICD-10-CM | POA: Diagnosis not present

## 2023-06-27 DIAGNOSIS — J90 Pleural effusion, not elsewhere classified: Secondary | ICD-10-CM | POA: Diagnosis not present

## 2023-06-27 DIAGNOSIS — C782 Secondary malignant neoplasm of pleura: Secondary | ICD-10-CM | POA: Diagnosis not present

## 2023-07-30 DIAGNOSIS — C7951 Secondary malignant neoplasm of bone: Secondary | ICD-10-CM | POA: Diagnosis not present

## 2023-07-30 DIAGNOSIS — C782 Secondary malignant neoplasm of pleura: Secondary | ICD-10-CM | POA: Diagnosis not present

## 2023-07-30 DIAGNOSIS — C50912 Malignant neoplasm of unspecified site of left female breast: Secondary | ICD-10-CM | POA: Diagnosis not present

## 2023-08-06 DIAGNOSIS — C7951 Secondary malignant neoplasm of bone: Secondary | ICD-10-CM | POA: Diagnosis not present

## 2023-08-06 DIAGNOSIS — C50912 Malignant neoplasm of unspecified site of left female breast: Secondary | ICD-10-CM | POA: Diagnosis not present

## 2023-08-06 DIAGNOSIS — Z853 Personal history of malignant neoplasm of breast: Secondary | ICD-10-CM | POA: Diagnosis not present

## 2023-08-06 DIAGNOSIS — C782 Secondary malignant neoplasm of pleura: Secondary | ICD-10-CM | POA: Diagnosis not present

## 2023-08-06 DIAGNOSIS — J90 Pleural effusion, not elsewhere classified: Secondary | ICD-10-CM | POA: Diagnosis not present

## 2023-08-06 DIAGNOSIS — Z79899 Other long term (current) drug therapy: Secondary | ICD-10-CM | POA: Diagnosis not present

## 2023-08-06 DIAGNOSIS — C787 Secondary malignant neoplasm of liver and intrahepatic bile duct: Secondary | ICD-10-CM | POA: Diagnosis not present

## 2023-08-13 DIAGNOSIS — C782 Secondary malignant neoplasm of pleura: Secondary | ICD-10-CM | POA: Diagnosis not present

## 2023-08-13 DIAGNOSIS — C50912 Malignant neoplasm of unspecified site of left female breast: Secondary | ICD-10-CM | POA: Diagnosis not present

## 2023-08-13 DIAGNOSIS — Z853 Personal history of malignant neoplasm of breast: Secondary | ICD-10-CM | POA: Diagnosis not present

## 2023-08-13 DIAGNOSIS — Z79899 Other long term (current) drug therapy: Secondary | ICD-10-CM | POA: Diagnosis not present

## 2023-08-13 DIAGNOSIS — J90 Pleural effusion, not elsewhere classified: Secondary | ICD-10-CM | POA: Diagnosis not present

## 2023-08-13 DIAGNOSIS — C7951 Secondary malignant neoplasm of bone: Secondary | ICD-10-CM | POA: Diagnosis not present

## 2023-08-26 DIAGNOSIS — C782 Secondary malignant neoplasm of pleura: Secondary | ICD-10-CM | POA: Diagnosis not present

## 2023-08-26 DIAGNOSIS — Z03818 Encounter for observation for suspected exposure to other biological agents ruled out: Secondary | ICD-10-CM | POA: Diagnosis not present

## 2023-08-26 DIAGNOSIS — J0100 Acute maxillary sinusitis, unspecified: Secondary | ICD-10-CM | POA: Diagnosis not present

## 2023-08-26 DIAGNOSIS — C50912 Malignant neoplasm of unspecified site of left female breast: Secondary | ICD-10-CM | POA: Diagnosis not present

## 2023-08-26 DIAGNOSIS — B349 Viral infection, unspecified: Secondary | ICD-10-CM | POA: Diagnosis not present

## 2023-09-24 DIAGNOSIS — J910 Malignant pleural effusion: Secondary | ICD-10-CM | POA: Diagnosis not present

## 2023-09-24 DIAGNOSIS — R0602 Shortness of breath: Secondary | ICD-10-CM | POA: Diagnosis not present

## 2023-09-30 DIAGNOSIS — J90 Pleural effusion, not elsewhere classified: Secondary | ICD-10-CM | POA: Diagnosis not present

## 2023-09-30 DIAGNOSIS — C782 Secondary malignant neoplasm of pleura: Secondary | ICD-10-CM | POA: Diagnosis not present

## 2023-09-30 DIAGNOSIS — C7951 Secondary malignant neoplasm of bone: Secondary | ICD-10-CM | POA: Diagnosis not present

## 2023-09-30 DIAGNOSIS — C50912 Malignant neoplasm of unspecified site of left female breast: Secondary | ICD-10-CM | POA: Diagnosis not present

## 2023-09-30 DIAGNOSIS — Z853 Personal history of malignant neoplasm of breast: Secondary | ICD-10-CM | POA: Diagnosis not present

## 2023-10-02 DIAGNOSIS — C50912 Malignant neoplasm of unspecified site of left female breast: Secondary | ICD-10-CM | POA: Diagnosis not present

## 2023-10-02 DIAGNOSIS — C782 Secondary malignant neoplasm of pleura: Secondary | ICD-10-CM | POA: Diagnosis not present

## 2023-10-02 DIAGNOSIS — C7951 Secondary malignant neoplasm of bone: Secondary | ICD-10-CM | POA: Diagnosis not present

## 2023-10-22 DIAGNOSIS — M5116 Intervertebral disc disorders with radiculopathy, lumbar region: Secondary | ICD-10-CM | POA: Diagnosis not present

## 2023-10-28 DIAGNOSIS — C782 Secondary malignant neoplasm of pleura: Secondary | ICD-10-CM | POA: Diagnosis not present

## 2023-10-28 DIAGNOSIS — C50912 Malignant neoplasm of unspecified site of left female breast: Secondary | ICD-10-CM | POA: Diagnosis not present

## 2023-10-28 DIAGNOSIS — C7951 Secondary malignant neoplasm of bone: Secondary | ICD-10-CM | POA: Diagnosis not present

## 2023-11-07 DIAGNOSIS — H5203 Hypermetropia, bilateral: Secondary | ICD-10-CM | POA: Diagnosis not present

## 2023-11-11 DIAGNOSIS — J90 Pleural effusion, not elsewhere classified: Secondary | ICD-10-CM | POA: Diagnosis not present

## 2023-11-11 DIAGNOSIS — C782 Secondary malignant neoplasm of pleura: Secondary | ICD-10-CM | POA: Diagnosis not present

## 2023-11-11 DIAGNOSIS — C7951 Secondary malignant neoplasm of bone: Secondary | ICD-10-CM | POA: Diagnosis not present

## 2023-11-11 DIAGNOSIS — C50912 Malignant neoplasm of unspecified site of left female breast: Secondary | ICD-10-CM | POA: Diagnosis not present

## 2023-11-11 DIAGNOSIS — C787 Secondary malignant neoplasm of liver and intrahepatic bile duct: Secondary | ICD-10-CM | POA: Diagnosis not present

## 2023-11-12 DIAGNOSIS — C7951 Secondary malignant neoplasm of bone: Secondary | ICD-10-CM | POA: Diagnosis not present

## 2023-11-12 DIAGNOSIS — Z853 Personal history of malignant neoplasm of breast: Secondary | ICD-10-CM | POA: Diagnosis not present

## 2023-11-12 DIAGNOSIS — C50912 Malignant neoplasm of unspecified site of left female breast: Secondary | ICD-10-CM | POA: Diagnosis not present

## 2023-11-12 DIAGNOSIS — C782 Secondary malignant neoplasm of pleura: Secondary | ICD-10-CM | POA: Diagnosis not present

## 2023-11-12 DIAGNOSIS — J90 Pleural effusion, not elsewhere classified: Secondary | ICD-10-CM | POA: Diagnosis not present

## 2023-11-19 DIAGNOSIS — C50912 Malignant neoplasm of unspecified site of left female breast: Secondary | ICD-10-CM | POA: Diagnosis not present

## 2023-11-19 DIAGNOSIS — C782 Secondary malignant neoplasm of pleura: Secondary | ICD-10-CM | POA: Diagnosis not present

## 2023-11-26 DIAGNOSIS — C782 Secondary malignant neoplasm of pleura: Secondary | ICD-10-CM | POA: Diagnosis not present

## 2023-11-26 DIAGNOSIS — C50912 Malignant neoplasm of unspecified site of left female breast: Secondary | ICD-10-CM | POA: Diagnosis not present

## 2023-12-05 DIAGNOSIS — E279 Disorder of adrenal gland, unspecified: Secondary | ICD-10-CM | POA: Diagnosis not present

## 2023-12-05 DIAGNOSIS — E729 Disorder of amino-acid metabolism, unspecified: Secondary | ICD-10-CM | POA: Diagnosis not present

## 2023-12-05 DIAGNOSIS — D509 Iron deficiency anemia, unspecified: Secondary | ICD-10-CM | POA: Diagnosis not present

## 2023-12-05 DIAGNOSIS — E59 Dietary selenium deficiency: Secondary | ICD-10-CM | POA: Diagnosis not present

## 2023-12-05 DIAGNOSIS — E539 Vitamin B deficiency, unspecified: Secondary | ICD-10-CM | POA: Diagnosis not present

## 2023-12-05 DIAGNOSIS — Z131 Encounter for screening for diabetes mellitus: Secondary | ICD-10-CM | POA: Diagnosis not present

## 2023-12-05 DIAGNOSIS — E282 Polycystic ovarian syndrome: Secondary | ICD-10-CM | POA: Diagnosis not present

## 2023-12-05 DIAGNOSIS — D519 Vitamin B12 deficiency anemia, unspecified: Secondary | ICD-10-CM | POA: Diagnosis not present

## 2023-12-05 DIAGNOSIS — E038 Other specified hypothyroidism: Secondary | ICD-10-CM | POA: Diagnosis not present

## 2023-12-05 DIAGNOSIS — E2839 Other primary ovarian failure: Secondary | ICD-10-CM | POA: Diagnosis not present

## 2023-12-16 DIAGNOSIS — R7989 Other specified abnormal findings of blood chemistry: Secondary | ICD-10-CM | POA: Diagnosis not present

## 2023-12-19 ENCOUNTER — Other Ambulatory Visit: Payer: Self-pay

## 2023-12-19 ENCOUNTER — Emergency Department

## 2023-12-19 ENCOUNTER — Emergency Department
Admission: EM | Admit: 2023-12-19 | Discharge: 2023-12-19 | Disposition: A | Discharge status: Home / Self Care | Attending: Emergency Medicine | Admitting: Emergency Medicine

## 2023-12-19 DIAGNOSIS — K5730 Diverticulosis of large intestine without perforation or abscess without bleeding: Secondary | ICD-10-CM | POA: Diagnosis not present

## 2023-12-19 DIAGNOSIS — R188 Other ascites: Secondary | ICD-10-CM | POA: Diagnosis not present

## 2023-12-19 DIAGNOSIS — R162 Hepatomegaly with splenomegaly, not elsewhere classified: Secondary | ICD-10-CM | POA: Diagnosis not present

## 2023-12-19 DIAGNOSIS — R140 Abdominal distension (gaseous): Secondary | ICD-10-CM | POA: Diagnosis present

## 2023-12-19 DIAGNOSIS — C787 Secondary malignant neoplasm of liver and intrahepatic bile duct: Secondary | ICD-10-CM | POA: Diagnosis not present

## 2023-12-19 DIAGNOSIS — R000 Tachycardia, unspecified: Secondary | ICD-10-CM | POA: Diagnosis not present

## 2023-12-19 LAB — COMPREHENSIVE METABOLIC PANEL WITH GFR
ALT: 146 U/L — ABNORMAL HIGH (ref 0–44)
AST: 280 U/L — ABNORMAL HIGH (ref 15–41)
Albumin: 3.2 g/dL — ABNORMAL LOW (ref 3.5–5.0)
Alkaline Phosphatase: 338 U/L — ABNORMAL HIGH (ref 38–126)
Anion gap: 13 (ref 5–15)
BUN: 17 mg/dL (ref 6–20)
CO2: 20 mmol/L — ABNORMAL LOW (ref 22–32)
Calcium: 8.5 mg/dL — ABNORMAL LOW (ref 8.9–10.3)
Chloride: 99 mmol/L (ref 98–111)
Creatinine, Ser: 0.81 mg/dL (ref 0.44–1.00)
GFR, Estimated: >60 mL/min (ref >60)
Glucose, Bld: 70 mg/dL (ref 70–99)
Potassium: 3.5 mmol/L (ref 3.5–5.1)
Sodium: 132 mmol/L — ABNORMAL LOW (ref 135–145)
Total Bilirubin: 1.5 mg/dL — ABNORMAL HIGH (ref 0.0–1.2)
Total Protein: 6.6 g/dL (ref 6.5–8.1)

## 2023-12-19 LAB — CBC
HCT: 42.4 % (ref 36.0–46.0)
Hemoglobin: 14.3 g/dL (ref 12.0–15.0)
MCH: 30 pg (ref 26.0–34.0)
MCHC: 33.7 g/dL (ref 30.0–36.0)
MCV: 88.9 fL (ref 80.0–100.0)
Platelets: 156 10*3/uL (ref 150–400)
RBC: 4.77 MIL/uL (ref 3.87–5.11)
RDW: 15.1 % (ref 11.5–15.5)
WBC: 4.6 10*3/uL (ref 4.0–10.5)
nRBC: 0 % (ref 0.0–0.2)

## 2023-12-19 LAB — URINALYSIS, ROUTINE W REFLEX MICROSCOPIC
Bilirubin Urine: NEGATIVE
Glucose, UA: NEGATIVE mg/dL
Hgb urine dipstick: NEGATIVE
Ketones, ur: 5 mg/dL — AB
Leukocytes,Ua: NEGATIVE
Nitrite: NEGATIVE
Protein, ur: NEGATIVE mg/dL
Specific Gravity, Urine: 1.005 (ref 1.005–1.030)
pH: 5 (ref 5.0–8.0)

## 2023-12-19 LAB — LIPASE, BLOOD: Lipase: 33 U/L (ref 11–51)

## 2023-12-19 NOTE — ED Notes (Signed)
 Dr. Derrill Kay at bedside. Dr. Derrill Kay informed of patient's temp of 100.3.

## 2023-12-19 NOTE — ED Provider Notes (Signed)
 [facility] Provider Note    Event Date/Time   First MD Initiated Contact with Patient 12/19/23 1722     (approximate)   History   Abdominal distention   HPI  Jamie Wu is a 53 y.o. female  who presents to the emergency department today because of concern for abdominal distention.  Patient states that she has noticed swelling in her abdomen over the past few days.  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  She has been feeling off for a little bit longer than that which she attributes to when her liver acts up.  She has never had swelling in her abdomen before.  She has had fluid in her lung that has required a thoracentesis in the past.  She denies any fevers or chills.    Physical Exam   Triage Vital Signs: ED Triage Vitals [12/19/23 1714]  Encounter Vitals Group     BP (!) 156/90     Systolic BP Percentile      Diastolic BP Percentile      Pulse Rate (!) 111     Resp 20     Temp 98.3 F (36.8 C)     Temp Source Oral     SpO2 97 %     Weight      Height      Head Circumference      Peak Flow      Pain Score 7     Pain Loc      Pain Education      Exclude from Growth Chart     Most recent vital signs: Vitals:   12/19/23 1714  BP: (!) 156/90  Pulse: (!) 111  Resp: 20  Temp: 98.3 F (36.8 C)  SpO2: 97%   General: Awake, alert, oriented. CV:  Good peripheral perfusion. Tachycardia. Resp:  Normal effort. Lungs clear. Abd:  Distended. Non tender.   ED Results / Procedures / Treatments   Labs (all labs ordered are listed, but only abnormal results are displayed) Labs Reviewed  COMPREHENSIVE METABOLIC PANEL WITH GFR - Abnormal; Notable for the following components:      Result Value   Sodium 132 (*)    CO2 20 (*)    Calcium 8.5 (*)    Albumin 3.2 (*)    AST 280 (*)    ALT 146 (*)    Alkaline Phosphatase 338 (*)    Total Bilirubin 1.5 (*)    All other components within normal limits   URINALYSIS, ROUTINE W REFLEX MICROSCOPIC - Abnormal; Notable for the following components:   Color, Urine YELLOW (*)    APPearance CLEAR (*)    Ketones, ur 5 (*)    All other components within normal limits  LIPASE, BLOOD  CBC     EKG  I, Phineas Semen, attending physician, personally viewed and interpreted this EKG  EKG Time: 1725 Rate: 104 Rhythm: sinus tachycardia Axis: normal Intervals: qtc 425 QRS: narrow ST changes: no st elevation Impression: abnormal ekg   RADIOLOGY I independently interpreted and visualized the CT abd/pel. My interpretation: Pleural effusions. Ascites. Radiology interpretation:  IMPRESSION:  Head    1. Small to moderate loculated pleural effusions bilaterally.  2. Hepatomegaly with multiple scattered hypodensities in the liver,  compatible with known metastatic disease and markedly increased from  2023.,  3. Splenomegaly.  4. Stable sclerotic lesions in the bones, suspicious for metastatic  disease.  5.  Mild-to-moderate ascites.  6. Diverticulosis without diverticulitis.  7. Aortic atherosclerosis.     PROCEDURES:  Critical Care performed: No    MEDICATIONS ORDERED IN ED: Medications - No data to display   IMPRESSION / MDM / ASSESSMENT AND PLAN / ED COURSE  I reviewed the triage vital signs and the nursing notes.                              Differential diagnosis includes, but is not limited to, ascites, SBO, diverticulitis  Patient's presentation is most consistent with acute presentation with potential threat to life or bodily function.  She presented to the emergency department today with primary concern for abdominal distention.  On exam patient's abdomen is distended.  No tenderness.  Patient does have a history of known liver mets.  Did have concern for ascites so CT was obtained.  This did show some ascites however no large amount.  Blood work does show slight elevation of LFTs.  While here patient developed  temperature of 100.3.  At this time however I do have low concern for SBP.  No abdominal tenderness.  Do think possible viral infection did discuss possibility of getting swab with patient however she declined at this time.  She did want to be discharged home and I think that is reasonable at this point.  Did discuss importance of close follow-up with her doctors. Encouraged patient to return for worsening symptoms.      FINAL CLINICAL IMPRESSION(S) / ED DIAGNOSES   Final diagnoses:  Other ascites    Note:  This document was prepared using Dragon voice recognition software and may include unintentional dictation errors.    Phineas Semen, MD 12/19/23 [phone]

## 2023-12-19 NOTE — ED Triage Notes (Addendum)
 Pt to ED via POV from home. Pt reports abdominal distention that has been getting worse x3 days. Pt reports hx stage 4 breast cancer with mets to liver. Pt reports N/V. Pt on oral medication for tx. Pt reports hx of having thoracentesis. Pt also endorses SOB. Pt doctors concerned for ascites coming from liver lesions.

## 2023-12-19 NOTE — ED Notes (Signed)
 ..  The patient is A&OX4, ambulatory at d/c with independent steady gait, NAD. Pt verbalized understanding of d/c instructions and follow up care.

## 2023-12-23 DIAGNOSIS — C7951 Secondary malignant neoplasm of bone: Secondary | ICD-10-CM | POA: Diagnosis not present

## 2023-12-23 DIAGNOSIS — Z8616 Personal history of COVID-19: Secondary | ICD-10-CM | POA: Diagnosis not present

## 2023-12-23 DIAGNOSIS — C799 Secondary malignant neoplasm of unspecified site: Secondary | ICD-10-CM | POA: Diagnosis not present

## 2023-12-23 DIAGNOSIS — J811 Chronic pulmonary edema: Secondary | ICD-10-CM | POA: Diagnosis not present

## 2023-12-23 DIAGNOSIS — Z170 Estrogen receptor positive status [ER+]: Secondary | ICD-10-CM | POA: Diagnosis not present

## 2023-12-23 DIAGNOSIS — E8809 Other disorders of plasma-protein metabolism, not elsewhere classified: Secondary | ICD-10-CM | POA: Diagnosis not present

## 2023-12-23 DIAGNOSIS — R1084 Generalized abdominal pain: Secondary | ICD-10-CM | POA: Diagnosis not present

## 2023-12-23 DIAGNOSIS — R0602 Shortness of breath: Secondary | ICD-10-CM | POA: Diagnosis not present

## 2023-12-23 DIAGNOSIS — C782 Secondary malignant neoplasm of pleura: Secondary | ICD-10-CM | POA: Diagnosis not present

## 2023-12-23 DIAGNOSIS — Z853 Personal history of malignant neoplasm of breast: Secondary | ICD-10-CM | POA: Diagnosis not present

## 2023-12-23 DIAGNOSIS — Z87891 Personal history of nicotine dependence: Secondary | ICD-10-CM | POA: Diagnosis not present

## 2023-12-23 DIAGNOSIS — J90 Pleural effusion, not elsewhere classified: Secondary | ICD-10-CM | POA: Diagnosis not present

## 2023-12-23 DIAGNOSIS — C787 Secondary malignant neoplasm of liver and intrahepatic bile duct: Secondary | ICD-10-CM | POA: Diagnosis not present

## 2023-12-23 DIAGNOSIS — K668 Other specified disorders of peritoneum: Secondary | ICD-10-CM | POA: Diagnosis not present

## 2023-12-23 DIAGNOSIS — R188 Other ascites: Secondary | ICD-10-CM | POA: Diagnosis not present

## 2023-12-23 DIAGNOSIS — I10 Essential (primary) hypertension: Secondary | ICD-10-CM | POA: Diagnosis not present

## 2023-12-24 DIAGNOSIS — R1084 Generalized abdominal pain: Secondary | ICD-10-CM | POA: Diagnosis not present

## 2023-12-24 DIAGNOSIS — R0602 Shortness of breath: Secondary | ICD-10-CM | POA: Diagnosis not present

## 2023-12-30 DIAGNOSIS — C50912 Malignant neoplasm of unspecified site of left female breast: Secondary | ICD-10-CM | POA: Diagnosis not present

## 2023-12-30 DIAGNOSIS — C7951 Secondary malignant neoplasm of bone: Secondary | ICD-10-CM | POA: Diagnosis not present

## 2023-12-30 DIAGNOSIS — C782 Secondary malignant neoplasm of pleura: Secondary | ICD-10-CM | POA: Diagnosis not present

## 2023-12-31 DIAGNOSIS — R180 Malignant ascites: Secondary | ICD-10-CM | POA: Diagnosis not present

## 2023-12-31 DIAGNOSIS — C787 Secondary malignant neoplasm of liver and intrahepatic bile duct: Secondary | ICD-10-CM | POA: Diagnosis not present

## 2023-12-31 DIAGNOSIS — C782 Secondary malignant neoplasm of pleura: Secondary | ICD-10-CM | POA: Diagnosis not present

## 2023-12-31 DIAGNOSIS — C7981 Secondary malignant neoplasm of breast: Secondary | ICD-10-CM | POA: Diagnosis not present

## 2023-12-31 DIAGNOSIS — C50919 Malignant neoplasm of unspecified site of unspecified female breast: Secondary | ICD-10-CM | POA: Diagnosis not present

## 2023-12-31 DIAGNOSIS — J910 Malignant pleural effusion: Secondary | ICD-10-CM | POA: Diagnosis not present

## 2024-01-09 DEATH — deceased
